# Patient Record
Sex: Female | Born: 1990 | Race: Black or African American | Hispanic: No | Marital: Single | Smoking: Never smoker
Health system: Southern US, Community
[De-identification: ages and names within clinical notes are randomized; demographics above are authoritative.]

## PROBLEM LIST (undated history)

## (undated) DIAGNOSIS — D649 Anemia, unspecified: Secondary | ICD-10-CM

## (undated) DIAGNOSIS — J45909 Unspecified asthma, uncomplicated: Secondary | ICD-10-CM

## (undated) DIAGNOSIS — O24419 Gestational diabetes mellitus in pregnancy, unspecified control: Secondary | ICD-10-CM

## (undated) DIAGNOSIS — J302 Other seasonal allergic rhinitis: Secondary | ICD-10-CM

## (undated) DIAGNOSIS — A549 Gonococcal infection, unspecified: Secondary | ICD-10-CM

## (undated) DIAGNOSIS — I10 Essential (primary) hypertension: Secondary | ICD-10-CM

## (undated) DIAGNOSIS — E282 Polycystic ovarian syndrome: Secondary | ICD-10-CM

## (undated) HISTORY — PX: HAND SURGERY: SHX662

## (undated) HISTORY — DX: Morbid (severe) obesity due to excess calories: E66.01

---

## 2012-01-15 ENCOUNTER — Emergency Department (HOSPITAL_COMMUNITY)
Admission: EM | Admit: 2012-01-15 | Discharge: 2012-01-15 | Disposition: A | Discharge status: Home / Self Care | Payer: Medicaid Other | Attending: Emergency Medicine | Admitting: Emergency Medicine

## 2012-01-15 ENCOUNTER — Encounter (HOSPITAL_COMMUNITY): Payer: Self-pay

## 2012-01-15 DIAGNOSIS — Z202 Contact with and (suspected) exposure to infections with a predominantly sexual mode of transmission: Secondary | ICD-10-CM

## 2012-01-15 DIAGNOSIS — M545 Low back pain, unspecified: Secondary | ICD-10-CM | POA: Insufficient documentation

## 2012-01-15 DIAGNOSIS — J45909 Unspecified asthma, uncomplicated: Secondary | ICD-10-CM | POA: Insufficient documentation

## 2012-01-15 HISTORY — DX: Unspecified asthma, uncomplicated: J45.909

## 2012-01-15 LAB — WET PREP, GENITAL
Clue Cells Wet Prep HPF POC: NONE SEEN
Trich, Wet Prep: NONE SEEN

## 2012-01-15 LAB — URINALYSIS, ROUTINE W REFLEX MICROSCOPIC
Hgb urine dipstick: NEGATIVE
Ketones, ur: NEGATIVE mg/dL
Protein, ur: NEGATIVE mg/dL
Urobilinogen, UA: 0.2 mg/dL (ref 0.0–1.0)

## 2012-01-15 LAB — POCT PREGNANCY, URINE: Preg Test, Ur: NEGATIVE

## 2012-01-15 MED ORDER — LIDOCAINE HCL (PF) 1 % IJ SOLN
INTRAMUSCULAR | Status: AC
  Administered 2012-01-15: 0.9 mL
  Filled 2012-01-15: qty 5

## 2012-01-15 MED ORDER — CEFTRIAXONE SODIUM 250 MG IJ SOLR
250.0000 mg | Freq: Once | INTRAMUSCULAR | Status: AC
  Administered 2012-01-15: 250 mg via INTRAMUSCULAR
  Filled 2012-01-15: qty 250

## 2012-01-15 MED ORDER — AZITHROMYCIN 250 MG PO TABS
1000.0000 mg | ORAL_TABLET | Freq: Once | ORAL | Status: AC
  Administered 2012-01-15: 1000 mg via ORAL
  Filled 2012-01-15: qty 4

## 2012-01-15 NOTE — ED Notes (Signed)
Pt denies burning or difficulty with urination. Denies vaginal d/c or odor. States she is having left lower back pain.

## 2012-01-15 NOTE — ED Provider Notes (Signed)
Medical screening examination/treatment/procedure(s) were performed by non-physician practitioner and as supervising physician I was immediately available for consultation/collaboration.  Doug Sou, MD 01/15/12 [phone]

## 2012-01-15 NOTE — ED Provider Notes (Signed)
History     [name]: [id number]  Arrival date & time 01/15/12  1228   First MD Initiated Contact with Patient 01/15/12 1410      Chief Complaint  Patient presents with  . SEXUALLY TRANSMITTED DISEASE    (Consider location/radiation/quality/duration/timing/severity/associated sxs/prior treatment) HPI Comments: Patient comes in today with concern that she may have a STD.  She reports that her boyfriend was recently diagnosed with Chlamydia.  She denies any vaginal discharge.  No fevers or chills.  No nausea or vomiting.  She is currently sexually active with her boyfriend and has unprotected sex.  She reports that she has also had lower back pain for the past 2 months.  She denies any difficulty with gait.  No numbness or tingling.  No urinary or bowel incontinence.  Pain worse when she twists her back.  Pain relieved with Ibuprofen.    The history is provided by the patient.    Past Medical History  Diagnosis Date  . Asthma     History reviewed. No pertinent past surgical history.  History reviewed. No pertinent family history.  History  Substance Use Topics  . Smoking status: Never Smoker   . Smokeless tobacco: Not on file  . Alcohol Use: No    OB History    Grav Para Term Preterm Abortions TAB SAB Ect Mult Living                  Review of Systems  Constitutional: Negative for fever and chills.  Gastrointestinal: Negative for nausea, vomiting and diarrhea.  Genitourinary: Negative for dysuria, urgency, frequency, hematuria, vaginal bleeding, vaginal discharge, difficulty urinating, genital sores, vaginal pain, pelvic pain and dyspareunia.  Musculoskeletal: Positive for back pain. Negative for gait problem.  Skin: Negative for rash.    Allergies  Aleve and Cherry  Home Medications   Current Outpatient Rx  Name Route Sig Dispense Refill  . ALBUTEROL SULFATE HFA 108 (90 BASE) MCG/ACT IN AERS Inhalation Inhale 2 puffs into the lungs every 6 (six) hours as needed. For  shortness of breath      BP 136/77  Pulse 86  Temp 98.3 F (36.8 C) (Oral)  Resp 18  SpO2 98%  LMP 12/28/2011  Physical Exam  Nursing note and vitals reviewed. Constitutional: She appears well-developed and well-nourished. No distress.       Morbidly obese  HENT:  Head: Normocephalic and atraumatic.  Neck: Normal range of motion. Neck supple.  Cardiovascular: Normal rate, regular rhythm and normal heart sounds.   Pulmonary/Chest: Effort normal and breath sounds normal.  Abdominal: Soft. Bowel sounds are normal. She exhibits no distension and no mass. There is no tenderness. There is no rebound and no guarding.  Genitourinary: Vagina normal and uterus normal. There is no rash, tenderness or lesion on the right labia. There is no rash, tenderness or lesion on the left labia. Cervix exhibits no motion tenderness. Right adnexum displays no mass, no tenderness and no fullness. Left adnexum displays no mass, no tenderness and no fullness.  Neurological: She is alert.  Skin: Skin is warm and dry. She is not diaphoretic.  Psychiatric: She has a normal mood and affect.    ED Course  Procedures (including critical care time)   Labs Reviewed  URINALYSIS, ROUTINE W REFLEX MICROSCOPIC  POCT PREGNANCY, URINE  GC/CHLAMYDIA PROBE AMP, GENITAL  WET PREP, GENITAL   No results found.   No diagnosis found.    MDM  Patient presenting today with possible STD exposure.  Her boyfriend was recently diagnosed with Chlamydia.  Pelvic exam performed.  No abnormal discharge.  No CMT or adnexal tenderness.  Patient afebrile.  UA and urine pregnancy negative.  Patient given prophylactic treatment with Ceftriaxone and Azithromycin.  GC/Chlamydia pending.  Return precautions discussed with patient.          Pascal Lux [place], PA-C 01/15/12 1630

## 2012-01-15 NOTE — ED Notes (Signed)
Was seen [facility] and dx with trichomonas then her boyfriend is now being treated for chlamydia and she needs to be treated, pt complains of back pain and left side swelling on back.

## 2012-07-26 ENCOUNTER — Encounter (HOSPITAL_COMMUNITY): Payer: Self-pay | Admitting: Emergency Medicine

## 2012-07-26 ENCOUNTER — Emergency Department (HOSPITAL_COMMUNITY)
Admission: EM | Admit: 2012-07-26 | Discharge: 2012-07-26 | Disposition: A | Discharge status: Home / Self Care | Payer: Self-pay | Attending: Emergency Medicine | Admitting: Emergency Medicine

## 2012-07-26 DIAGNOSIS — Z8742 Personal history of other diseases of the female genital tract: Secondary | ICD-10-CM | POA: Insufficient documentation

## 2012-07-26 DIAGNOSIS — L0201 Cutaneous abscess of face: Secondary | ICD-10-CM | POA: Insufficient documentation

## 2012-07-26 DIAGNOSIS — L03211 Cellulitis of face: Secondary | ICD-10-CM | POA: Insufficient documentation

## 2012-07-26 DIAGNOSIS — J45909 Unspecified asthma, uncomplicated: Secondary | ICD-10-CM | POA: Insufficient documentation

## 2012-07-26 MED ORDER — SULFAMETHOXAZOLE-TRIMETHOPRIM 800-160 MG PO TABS: 1.0000 | ORAL_TABLET | Freq: Two times a day (BID) | ORAL | Status: DC

## 2012-07-26 NOTE — ED Provider Notes (Signed)
History    This chart was scribed for non-physician practitioner working with Carleene Cooper III, MD by Frederik Pear, ED Scribe. This patient was seen in room TR10C/TR10C and the patient's care was started at 2008.   [name]: [id number]  Arrival date & time 07/26/12  1908   First MD Initiated Contact with Patient 07/26/12 2008      Chief Complaint  Patient presents with  . Abscess    (Consider location/radiation/quality/duration/timing/severity/associated sxs/prior treatment) The history is provided by the patient and medical records. No language interpreter was used.    Tricia Ray is a 22 y.o. female who presents to the Emergency Department complaining of a gradual onset, constant- non-radiating abscess that began a week ago, but worsened yesterday after she popped it. She states that she was able to obtain purulent drainage from the area after she popped it. She reports that she treated the area at home with hot compresses and applying toothpaste.She has a h/o of PCOS, but denies any h/o of DM or any other chronic medical conditions that require daily medication. She is allergic to Aleve and cherries. She denies any h/o of IV drug use.   Past Medical History  Diagnosis Date  . Asthma     History reviewed. No pertinent past surgical history.  History reviewed. No pertinent family history.  History  Substance Use Topics  . Smoking status: Never Smoker   . Smokeless tobacco: Not on file  . Alcohol Use: No    OB History   Grav Para Term Preterm Abortions TAB SAB Ect Mult Living                  Review of Systems  Constitutional: Negative for diaphoresis, appetite change and unexpected weight change.  HENT: Negative for mouth sores and neck stiffness.   Eyes: Negative for visual disturbance.  Respiratory: Negative for cough, chest tightness, shortness of breath and wheezing.   Cardiovascular: Negative for chest pain.  Gastrointestinal: Negative for abdominal pain, diarrhea  and constipation.  Endocrine: Negative for polydipsia, polyphagia and polyuria.  Genitourinary: Negative for dysuria, urgency, frequency and hematuria.  Musculoskeletal: Negative for back pain.  Skin: Negative for rash.       Abscess  Allergic/Immunologic: Negative for immunocompromised state.  Neurological: Negative for syncope and light-headedness.  Hematological: Does not bruise/bleed easily.  Psychiatric/Behavioral: Negative for sleep disturbance. The patient is not nervous/anxious.   All other systems reviewed and are negative.    Allergies  Aleve and Cherry  Home Medications   Current Outpatient Rx  Name  Route  Sig  Dispense  Refill  . sulfamethoxazole-trimethoprim (SEPTRA DS) 800-160 MG per tablet   Oral   Take 1 tablet by mouth every 12 (twelve) hours.   20 tablet   0     BP 140/74  Pulse 73  Temp(Src) 98.4 F (36.9 C) (Oral)  Resp 18  SpO2 98%  LMP 06/19/2012  Physical Exam  Nursing note and vitals reviewed. Constitutional: She is oriented to person, place, and time. She appears well-developed and well-nourished. No distress.  HENT:  Head: Normocephalic and atraumatic.    Right Ear: External ear normal.  Left Ear: External ear normal.  Nose: Nose normal.  Mouth/Throat: Uvula is midline, oropharynx is clear and moist and mucous membranes are normal. No oropharyngeal exudate, posterior oropharyngeal edema, posterior oropharyngeal erythema or tonsillar abscesses.  Eyes: Conjunctivae are normal. No scleral icterus.  Neck: Normal range of motion.  Cardiovascular: Normal rate, regular rhythm, normal heart  sounds and intact distal pulses.  Exam reveals no gallop and no friction rub.   No murmur heard. Pulmonary/Chest: Effort normal and breath sounds normal. No respiratory distress. She has no wheezes. She has no rales.  Abdominal: Soft. She exhibits no distension. There is no tenderness.  Lymphadenopathy:    She has no cervical adenopathy.  Neurological: She  is alert and oriented to person, place, and time. No cranial nerve deficit.  Skin: Skin is warm and dry. Lesion noted. She is not diaphoretic. There is erythema.  On the left side of the chin, there is an erythematous, indurated lesion with mild cellulitis. There is a scab on the top that has previously been draining.  Psychiatric: She has a normal mood and affect.    ED Course  Procedures (including critical care time)  DIAGNOSTIC STUDIES: Oxygen Saturation is 98% on room air, normal by my interpretation.    COORDINATION OF CARE:  20:47- Discussed planned course of treatment with the patient, including applying Neosporin to the area, Bactrim, and applying hot compresses 3x a day, who is agreeable at this time.  20:49- INCISION AND DRAINAGE Performed by: Dahlia Client Jonquil Stubbe- PA-C Consent: Verbal consent obtained. Risks and benefits: risks, benefits and alternatives were discussed Type: abscess  Body area: left side of the chin  Anesthesia: local infiltration  Incision was made with an 18 gauge needle.  Local anesthetic: lidocaine 2% without epinephrine  Anesthetic total: 1cc  Complexity: complex  Drainage: purulent  Drainage amount: minimal  Packing material: N/A  Patient tolerance: Patient tolerated the procedure well with no immediate complications.     Labs Reviewed - No data to display No results found.   1. Abscess of face       MDM  Zadie Monical presents with small abscess to the face.  Patient with skin abscess amenable to incision and drainage.  Abscess was not large enough to warrant packing or drain,  wound recheck in 2 days. Encouraged home warm soaks and flushing.  Mild signs of cellulitis is surrounding skin.  Will d/c to home.  Pt given bactrim as very little purulent drainage was expressed from the site.  He is without history of diabetes or at risk for HIV. The patient has not been on prednisone for her asthma in the recent past.I have also  discussed reasons to return immediately to the ER.  Patient expresses understanding and agrees with plan.    1. Medications: Bactrim, usual home medications 2. Treatment: rest, drink plenty of fluids, warm compress 3. Follow Up: Please followup with your primary doctor for discussion of your diagnoses and further evaluation after today's visit; if you do not have a primary care doctor use the resource guide provided to find one;    I personally performed the services described in this documentation, which was scribed in my presence. The recorded information has been reviewed and is accurate.        Dahlia Client Katlynn Naser, PA-C 07/26/12 2105

## 2012-07-26 NOTE — ED Notes (Signed)
Patient complaining of bump/pimple on chin that appeared last Saturday.  Patient reports that she popped it yesterday and it became worse.  Scabbed area noted to chin with yellow drainage.

## 2012-07-27 NOTE — ED Provider Notes (Signed)
Medical screening examination/treatment/procedure(s) were performed by non-physician practitioner and as supervising physician I was immediately available for consultation/collaboration.   Carleene Cooper III, MD 07/27/12 1302

## 2013-01-07 ENCOUNTER — Emergency Department (HOSPITAL_COMMUNITY)
Admission: EM | Admit: 2013-01-07 | Discharge: 2013-01-07 | Discharge status: Against Medical Advice | Payer: Self-pay | Attending: Emergency Medicine | Admitting: Emergency Medicine

## 2013-01-07 ENCOUNTER — Encounter (HOSPITAL_COMMUNITY): Payer: Self-pay | Admitting: Emergency Medicine

## 2013-01-07 DIAGNOSIS — R109 Unspecified abdominal pain: Secondary | ICD-10-CM | POA: Insufficient documentation

## 2013-01-07 DIAGNOSIS — J45909 Unspecified asthma, uncomplicated: Secondary | ICD-10-CM | POA: Insufficient documentation

## 2013-01-07 LAB — COMPREHENSIVE METABOLIC PANEL
ALT: 13 U/L (ref 0–35)
AST: 14 U/L (ref 0–37)
Alkaline Phosphatase: 86 U/L (ref 39–117)
CO2: 23 mEq/L (ref 19–32)
Chloride: 107 mEq/L (ref 96–112)
GFR calc Af Amer: >90 mL/min (ref >90)
GFR calc non Af Amer: >90 mL/min (ref >90)
Glucose, Bld: 83 mg/dL (ref 70–99)
Potassium: 4.1 mEq/L (ref 3.5–5.1)
Sodium: 138 mEq/L (ref 135–145)

## 2013-01-07 LAB — CBC WITH DIFFERENTIAL/PLATELET
Basophils Absolute: 0 10*3/uL (ref 0.0–0.1)
Lymphocytes Relative: 36 % (ref 12–46)
Lymphs Abs: 2.7 10*3/uL (ref 0.7–4.0)
MCV: 84.8 fL (ref 78.0–100.0)
Neutro Abs: 4 10*3/uL (ref 1.7–7.7)
Neutrophils Relative %: 54 % (ref 43–77)
Platelets: 231 10*3/uL (ref 150–400)
RBC: 4.47 MIL/uL (ref 3.87–5.11)
WBC: 7.5 10*3/uL (ref 4.0–10.5)

## 2013-01-07 NOTE — ED Notes (Signed)
Patient presents to ED today with complaints of abdominal pain n/v/d since last night.

## 2013-01-21 ENCOUNTER — Emergency Department (HOSPITAL_COMMUNITY)
Admission: EM | Admit: 2013-01-21 | Discharge: 2013-01-21 | Disposition: A | Discharge status: Home / Self Care | Payer: Self-pay | Attending: Emergency Medicine | Admitting: Emergency Medicine

## 2013-01-21 ENCOUNTER — Encounter (HOSPITAL_COMMUNITY): Payer: Self-pay | Admitting: Nurse Practitioner

## 2013-01-21 DIAGNOSIS — J45909 Unspecified asthma, uncomplicated: Secondary | ICD-10-CM | POA: Insufficient documentation

## 2013-01-21 DIAGNOSIS — R109 Unspecified abdominal pain: Secondary | ICD-10-CM | POA: Insufficient documentation

## 2013-01-21 DIAGNOSIS — Z3202 Encounter for pregnancy test, result negative: Secondary | ICD-10-CM | POA: Insufficient documentation

## 2013-01-21 DIAGNOSIS — Z79899 Other long term (current) drug therapy: Secondary | ICD-10-CM | POA: Insufficient documentation

## 2013-01-21 DIAGNOSIS — N390 Urinary tract infection, site not specified: Secondary | ICD-10-CM | POA: Insufficient documentation

## 2013-01-21 LAB — URINALYSIS, ROUTINE W REFLEX MICROSCOPIC
Bilirubin Urine: NEGATIVE
Hgb urine dipstick: NEGATIVE
Ketones, ur: NEGATIVE mg/dL
Nitrite: POSITIVE — AB
Urobilinogen, UA: 1 mg/dL (ref 0.0–1.0)

## 2013-01-21 LAB — URINE MICROSCOPIC-ADD ON

## 2013-01-21 MED ORDER — AZITHROMYCIN 250 MG PO TABS
1000.0000 mg | ORAL_TABLET | Freq: Once | ORAL | Status: AC
  Administered 2013-01-21: 1000 mg via ORAL
  Filled 2013-01-21: qty 4

## 2013-01-21 MED ORDER — CEFTRIAXONE SODIUM 250 MG IJ SOLR
250.0000 mg | Freq: Once | INTRAMUSCULAR | Status: AC
  Administered 2013-01-21: 250 mg via INTRAMUSCULAR
  Filled 2013-01-21: qty 250

## 2013-01-21 MED ORDER — TRAMADOL HCL 50 MG PO TABS: 50.0000 mg | ORAL_TABLET | Freq: Four times a day (QID) | ORAL | Status: DC | PRN

## 2013-01-21 MED ORDER — LIDOCAINE HCL (PF) 1 % IJ SOLN
INTRAMUSCULAR | Status: AC
  Administered 2013-01-21: 2 mL
  Filled 2013-01-21: qty 5

## 2013-01-21 MED ORDER — METRONIDAZOLE 500 MG PO TABS: 500.0000 mg | ORAL_TABLET | Freq: Two times a day (BID) | ORAL | Status: DC

## 2013-01-21 MED ORDER — SULFAMETHOXAZOLE-TRIMETHOPRIM 800-160 MG PO TABS: 1.0000 | ORAL_TABLET | Freq: Two times a day (BID) | ORAL | Status: DC

## 2013-01-21 NOTE — ED Notes (Signed)
Family at bedside. 

## 2013-01-21 NOTE — ED Notes (Signed)
Pt reports she was here last week for nausea and pelvic cramps, had to leave [facility] because she was late for work. Continues to have symptoms and this week her boyfriend was diagnosed with NGU so she is concerned for std

## 2013-01-21 NOTE — ED Notes (Signed)
Pt discharged.Vital signs stable and GCS 15 

## 2013-01-21 NOTE — ED Provider Notes (Signed)
[name]: [id number]     Arrival date & time 01/21/13  1339 History     First MD Initiated Contact with Patient 01/21/13 1740     Chief Complaint  Patient presents with  . Abdominal Cramping   (Consider location/radiation/quality/duration/timing/severity/associated sxs/prior Treatment) HPI Comments: 22 year old female who presents with lower abdominal cramping. She was seen here last week for nausea and pelvic cramps left before her workup was complete. She returns today after her boyfriend told her that he had a nongonococcal urethritis. She states that she's been having mild abdominal cramps which have been relatively persistent over time but waxed and waned throughout the day. There is no fevers chills nausea or vomiting. She is concerned for STD as she is not using protection. She only has one sexual partner, she has had 2 other sexual partners in the past, has never contracted STD and states that she has been tested regularly when she changes partners. Denies rashes, diarrhea, dysuria  Patient is a 22 y.o. female presenting with cramps. The history is provided by the patient and a relative.  Abdominal Cramping    Past Medical History  Diagnosis Date  . Asthma    History reviewed. No pertinent past surgical history. History reviewed. No pertinent family history. History  Substance Use Topics  . Smoking status: Never Smoker   . Smokeless tobacco: Not on file  . Alcohol Use: No   OB History   Grav Para Term Preterm Abortions TAB SAB Ect Mult Living                 Review of Systems  All other systems reviewed and are negative.    Allergies  Shrimp; Aleve; and Cherry  Home Medications   Current Outpatient Rx  Name  Route  Sig  Dispense  Refill  . albuterol (PROVENTIL HFA;VENTOLIN HFA) 108 (90 BASE) MCG/ACT inhaler   Inhalation   Inhale 2 puffs into the lungs every 6 (six) hours as needed for wheezing or shortness of breath.          . cetirizine (ZYRTEC) 10 MG  tablet   Oral   Take 10 mg by mouth daily as needed for allergies.         Marland Kitchen ibuprofen (ADVIL,MOTRIN) 200 MG tablet   Oral   Take 400 mg by mouth daily as needed for pain.         . metroNIDAZOLE (FLAGYL) 500 MG tablet   Oral   Take 1 tablet (500 mg total) by mouth 2 (two) times daily.   20 tablet   0   . sulfamethoxazole-trimethoprim (SEPTRA DS) 800-160 MG per tablet   Oral   Take 1 tablet by mouth every 12 (twelve) hours.   10 tablet   0   . traMADol (ULTRAM) 50 MG tablet   Oral   Take 1 tablet (50 mg total) by mouth every 6 (six) hours as needed for pain.   15 tablet   0    BP 132/78  Pulse 73  Temp(Src) 97.6 F (36.4 C) (Oral)  Resp 18  Ht 5\' 7"  (1.702 m)  Wt 275 lb (124.739 kg)  BMI 43.06 kg/m2  SpO2 100% Physical Exam  Nursing note and vitals reviewed. Constitutional: She appears well-developed and well-nourished. No distress.  HENT:  Head: Normocephalic and atraumatic.  Mouth/Throat: Oropharynx is clear and moist. No oropharyngeal exudate.  Eyes: Conjunctivae and EOM are normal. Pupils are equal, round, and reactive to light. Right eye exhibits no discharge. Left  eye exhibits no discharge. No scleral icterus.  Neck: Normal range of motion. Neck supple. No JVD present. No thyromegaly present.  Cardiovascular: Normal rate, regular rhythm, normal heart sounds and intact distal pulses.  Exam reveals no gallop and no friction rub.   No murmur heard. Pulmonary/Chest: Effort normal and breath sounds normal. No respiratory distress. She has no wheezes. She has no rales.  Abdominal: Soft. Bowel sounds are normal. She exhibits no distension and no mass. There is no tenderness.  Genitourinary:  Patient declines exam, deferred to primary doctor or GYN  Musculoskeletal: Normal range of motion. She exhibits no edema and no tenderness.  Lymphadenopathy:    She has no cervical adenopathy.  Neurological: She is alert. Coordination normal.  Skin: Skin is warm and dry.  No rash noted. No erythema.  Psychiatric: She has a normal mood and affect. Her behavior is normal.    ED Course   Procedures (including critical care time)  Labs Reviewed  URINALYSIS, ROUTINE W REFLEX MICROSCOPIC - Abnormal; Notable for the following:    APPearance CLOUDY (*)    Nitrite POSITIVE (*)    Leukocytes, UA SMALL (*)    All other components within normal limits  URINE MICROSCOPIC-ADD ON - Abnormal; Notable for the following:    Squamous Epithelial / LPF FEW (*)    Bacteria, UA MANY (*)    All other components within normal limits  URINE CULTURE  POCT PREGNANCY, URINE   No results found. 1. Abdominal cramping   2. UTI (urinary tract infection)     MDM  The patient is morbidly obese but does have a soft and nontender abdomen. Her urinalysis reveals that she has many bacteria but positive nitrite and few white blood cells. In the absence of any other strong sources for this symptom I would suggest treating her with antibiotics for urinary infection. Because of her boyfriends STD status would also be worthwhile to treat her with troponin I therapy with Flagyl, 1 g azithromycin, 250 mg of Rocephin intramuscular and Bactrim for her UTI. The patient is in agreement with the plan appears nontoxic.   Meds given in ED:  Medications  cefTRIAXone (ROCEPHIN) injection 250 mg (not administered)  azithromycin (ZITHROMAX) tablet 1,000 mg (not administered)    New Prescriptions   METRONIDAZOLE (FLAGYL) 500 MG TABLET    Take 1 tablet (500 mg total) by mouth 2 (two) times daily.   SULFAMETHOXAZOLE-TRIMETHOPRIM (SEPTRA DS) 800-160 MG PER TABLET    Take 1 tablet by mouth every 12 (twelve) hours.   TRAMADOL (ULTRAM) 50 MG TABLET    Take 1 tablet (50 mg total) by mouth every 6 (six) hours as needed for pain.      Vida Roller, MD 01/21/13 [phone]

## 2013-01-22 LAB — URINE CULTURE: Colony Count: >=100000

## 2013-01-23 NOTE — ED Notes (Signed)
+   Urine Culture- treated with appropriate medication per protocol MD. 

## 2013-05-28 ENCOUNTER — Emergency Department (HOSPITAL_COMMUNITY)
Admission: EM | Admit: 2013-05-28 | Discharge: 2013-05-28 | Disposition: A | Discharge status: Home / Self Care | Payer: Medicaid Other | Attending: Emergency Medicine | Admitting: Emergency Medicine

## 2013-05-28 ENCOUNTER — Encounter (HOSPITAL_COMMUNITY): Payer: Self-pay | Admitting: Emergency Medicine

## 2013-05-28 ENCOUNTER — Emergency Department (HOSPITAL_COMMUNITY): Payer: Medicaid Other

## 2013-05-28 DIAGNOSIS — Y93D2 Activity, sewing: Secondary | ICD-10-CM | POA: Insufficient documentation

## 2013-05-28 DIAGNOSIS — W278XXA Contact with other nonpowered hand tool, initial encounter: Secondary | ICD-10-CM | POA: Insufficient documentation

## 2013-05-28 DIAGNOSIS — Z79899 Other long term (current) drug therapy: Secondary | ICD-10-CM | POA: Insufficient documentation

## 2013-05-28 DIAGNOSIS — S60559A Superficial foreign body of unspecified hand, initial encounter: Secondary | ICD-10-CM | POA: Insufficient documentation

## 2013-05-28 DIAGNOSIS — M2550 Pain in unspecified joint: Secondary | ICD-10-CM | POA: Insufficient documentation

## 2013-05-28 DIAGNOSIS — Y929 Unspecified place or not applicable: Secondary | ICD-10-CM | POA: Insufficient documentation

## 2013-05-28 DIAGNOSIS — S60552A Superficial foreign body of left hand, initial encounter: Secondary | ICD-10-CM

## 2013-05-28 DIAGNOSIS — J45909 Unspecified asthma, uncomplicated: Secondary | ICD-10-CM | POA: Insufficient documentation

## 2013-05-28 MED ORDER — AMOXICILLIN-POT CLAVULANATE 875-125 MG PO TABS: 1.0000 | ORAL_TABLET | Freq: Two times a day (BID) | ORAL | Status: DC

## 2013-05-28 NOTE — ED Notes (Signed)
Pt st's she put her hand down on a sewing needle 5 days ago.  Now palm of hand is swollen and painful.  St's not sure if part of the needle is still in her hand.

## 2013-05-28 NOTE — ED Provider Notes (Signed)
[name]: [id number]     Arrival date & time 05/28/13  1602 History  This chart was scribed for non-physician practitioner Tricia Forth, PA-C, working with Tricia Jakes, MD by Tricia Ray, ED Scribe. This patient was seen in room TR08C/TR08C and the patient's care was started at 1602.    Chief Complaint  Patient presents with  . Foreign Body    The history is provided by the patient and medical records. No language interpreter was used.    HPI Comments: Tricia Ray is a 22 y.o. female who presents to the Emergency Department complaining of a foreign body to the left palm that occurred 5 days ago. Pt states she was sewing when she put her hand down on a sewing needle. She states feeling a part of the needle stuck within the palmer surface of her left index finger. Her last tetanus was about 2 years ago. She denies drainage, fever, chills, nausea, vomiting.      Past Medical History  Diagnosis Date  . Asthma    History reviewed. No pertinent past surgical history. No family history on file. History  Substance Use Topics  . Smoking status: Never Smoker   . Smokeless tobacco: Not on file  . Alcohol Use: No    OB History   Grav Para Term Preterm Abortions TAB SAB Ect Mult Living                 Review of Systems  Constitutional: Negative for fever and chills.  Respiratory: Negative for shortness of breath.   Gastrointestinal: Negative for nausea, vomiting and abdominal pain.  Musculoskeletal: Positive for arthralgias and joint swelling.  Skin: Positive for wound (foreign body ).  Allergic/Immunologic: Negative for immunocompromised state.  Neurological: Negative for numbness.  Hematological: Does not bruise/bleed easily.    Allergies  Shrimp; Aleve; and Cherry  Home Medications   Current Outpatient Rx  Name  Route  Sig  Dispense  Refill  . albuterol (PROVENTIL HFA;VENTOLIN HFA) 108 (90 BASE) MCG/ACT inhaler   Inhalation   Inhale 2 puffs into the lungs every 6  (six) hours as needed for wheezing or shortness of breath.          Marland Kitchen amoxicillin-clavulanate (AUGMENTIN) 875-125 MG per tablet   Oral   Take 1 tablet by mouth 2 (two) times daily. One po bid x 7 days   14 tablet   0   . cetirizine (ZYRTEC) 10 MG tablet   Oral   Take 10 mg by mouth daily as needed for allergies.         Marland Kitchen ibuprofen (ADVIL,MOTRIN) 200 MG tablet   Oral   Take 400 mg by mouth daily as needed for pain.         . metroNIDAZOLE (FLAGYL) 500 MG tablet   Oral   Take 1 tablet (500 mg total) by mouth 2 (two) times daily.   20 tablet   0   . sulfamethoxazole-trimethoprim (SEPTRA DS) 800-160 MG per tablet   Oral   Take 1 tablet by mouth every 12 (twelve) hours.   10 tablet   0   . traMADol (ULTRAM) 50 MG tablet   Oral   Take 1 tablet (50 mg total) by mouth every 6 (six) hours as needed for pain.   15 tablet   0    BP 130/73  Pulse 89  Temp(Src) 97.8 F (36.6 C) (Oral)  Resp 16  Ht 5\' 7"  (1.702 m)  Wt 304 lb 1 oz (  137.922 kg)  BMI 47.61 kg/m2  SpO2 98%  LMP 04/14/2013 Physical Exam  Nursing note and vitals reviewed. Constitutional: She is oriented to person, place, and time. She appears well-developed and well-nourished. No distress.  HENT:  Head: Normocephalic and atraumatic.  Eyes: Conjunctivae are normal.  Neck: Normal range of motion.  Cardiovascular: Normal rate, regular rhythm, normal heart sounds and intact distal pulses.   No murmur heard. Capillary refill less than 3 seconds  Pulmonary/Chest: Effort normal and breath sounds normal. No respiratory distress.  Musculoskeletal: She exhibits tenderness. She exhibits no edema.  ROM: Decreased ROM of the pointer finger on the left due to pain and swelling.   Left MCP joint on pointer finger is swollen and tender. Palmer surface of the left hand is erythematous without induration or evidence of abscess.   Neurological: She is alert and oriented to person, place, and time. She exhibits normal  muscle tone. Coordination normal.  Sensation intact to dull and sharp Strength 5/5 in the bilateral upper extremities with strong grip strength  Skin: Skin is warm and dry. No rash noted. She is not diaphoretic. No erythema.  No tenting of the skin  Psychiatric: She has a normal mood and affect.    ED Course  Procedures (including critical care time)  DIAGNOSTIC STUDIES: Oxygen Saturation is 98% on RA, normal by my interpretation.    COORDINATION OF CARE: 5:30 PM Discussed treatment plan with pt at bedside and pt agreed to plan.   Labs Review Labs Reviewed - No data to display Imaging Review Dg Hand Complete Left  05/28/2013   CLINICAL DATA:  Puncture wound with a sewing needle  EXAM: LEFT HAND - COMPLETE 3+ VIEW  COMPARISON:  None.  FINDINGS: Small linear radiopaque foreign body projects along the left 2nd MCP joint compatible with a needle fragment. No osseous abnormality or malalignment. Preserved joint spaces.  IMPRESSION: Radiopaque needle fragment foreign body along the left 2nd MCP joint.   Electronically Signed   By: Tricia Ray M.D.   On: 05/28/2013 16:57    EKG Interpretation   None       MDM   1. Foreign body of left hand, initial encounter      Tricia Ray presents with foreign body in the left hand.  X-ray confirms a sewing needle in the palm of the left hand.  Wound is not draining pus. No induration or evidence of abscess.  I personally reviewed the imaging tests through PACS system  I reviewed available ER/hospitalization records through the EMR  I discussed this with Dr. Mina Ray who reports he will take her to surgery later in the week.  Will put her on Augmentin as antibiotic prophylaxis.  He will contact her tomorrow to schedule surgery.  It has been determined that no acute conditions requiring further emergency intervention are present at this time. The patient/guardian have been advised of the diagnosis and plan. We have discussed signs and  symptoms that warrant return to the ED, such as changes or worsening in symptoms.   Vital signs are stable at discharge.   BP 130/73  Pulse 89  Temp(Src) 97.8 F (36.6 C) (Oral)  Resp 16  Ht 5\' 7"  (1.702 m)  Wt 304 lb 1 oz (137.922 kg)  BMI 47.61 kg/m2  SpO2 98%  LMP 04/14/2013  Patient/guardian has voiced understanding and agreed to follow-up with the PCP or specialist.    I personally performed the services described in this documentation, which was scribed in  my presence. The recorded information has been reviewed and is accurate.   Tricia Forth, PA-C 05/28/13 [phone]

## 2013-06-02 NOTE — ED Provider Notes (Signed)
Medical screening examination/treatment/procedure(s) were performed by non-physician practitioner and as supervising physician I was immediately available for consultation/collaboration.  EKG Interpretation   None         [name], MD 06/02/13 [phone]

## 2014-10-02 ENCOUNTER — Encounter (HOSPITAL_COMMUNITY): Payer: Self-pay | Admitting: Emergency Medicine

## 2014-10-02 ENCOUNTER — Emergency Department (HOSPITAL_COMMUNITY): Payer: 59

## 2014-10-02 ENCOUNTER — Emergency Department (HOSPITAL_COMMUNITY)
Admission: EM | Admit: 2014-10-02 | Discharge: 2014-10-02 | Disposition: A | Discharge status: Home / Self Care | Payer: 59 | Attending: Emergency Medicine | Admitting: Emergency Medicine

## 2014-10-02 DIAGNOSIS — Z79899 Other long term (current) drug therapy: Secondary | ICD-10-CM | POA: Diagnosis not present

## 2014-10-02 DIAGNOSIS — J45909 Unspecified asthma, uncomplicated: Secondary | ICD-10-CM | POA: Diagnosis present

## 2014-10-02 DIAGNOSIS — Z792 Long term (current) use of antibiotics: Secondary | ICD-10-CM | POA: Diagnosis not present

## 2014-10-02 DIAGNOSIS — J45901 Unspecified asthma with (acute) exacerbation: Secondary | ICD-10-CM

## 2014-10-02 MED ORDER — ALBUTEROL SULFATE HFA 108 (90 BASE) MCG/ACT IN AERS: 1.0000 | INHALATION_SPRAY | Freq: Four times a day (QID) | RESPIRATORY_TRACT | Status: DC | PRN

## 2014-10-02 NOTE — ED Provider Notes (Signed)
[name]: [id number]     Arrival date & time 10/02/14  1418 History  This chart was scribed for non-physician practitioner, Teressa Lower, NP, working with Mancel Bale, MD by Charline Bills, ED Scribe. This patient was seen in room TR08C/TR08C and the patient's care was started at 2:43 PM.   Chief Complaint  Patient presents with  . Asthma   The history is provided by the patient. No language interpreter was used.   HPI Comments: Tricia Ray is a 24 y.o. female, with a h/o asthma, who presents to the Emergency Department complaining of chest tightness since yesterday. 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 She does not have an inhaler at home; states that she has not had a flare up in a while. Pt denies fever. She is a smoker. LNMP 07/23/14; pt reports irregular menstrual periods.   Past Medical History  Diagnosis Date  . Asthma    Past Surgical History  Procedure Laterality Date  . Hand surgery     No family history on file. History  Substance Use Topics  . Smoking status: Never Smoker   . Smokeless tobacco: Not on file  . Alcohol Use: No   OB History    No data available     Review of Systems  Constitutional: Negative for fever.  Respiratory: Positive for chest tightness.   All other systems reviewed and are negative.  Allergies  Shrimp; Aleve; and Cherry  Home Medications   Prior to Admission medications   Medication Sig Start Date End Date Taking? Authorizing Provider  albuterol (PROVENTIL HFA;VENTOLIN HFA) 108 (90 BASE) MCG/ACT inhaler Inhale 2 puffs into the lungs every 6 (six) hours as needed for wheezing or shortness of breath.     Historical Provider, MD  amoxicillin-clavulanate (AUGMENTIN) 875-125 MG per tablet Take 1 tablet by mouth 2 (two) times daily. One po bid x 7 days 05/28/13   Dahlia Client Muthersbaugh, PA-C  cetirizine (ZYRTEC) 10 MG tablet Take 10 mg by mouth daily as needed for allergies.    Historical Provider,  MD  ibuprofen (ADVIL,MOTRIN) 200 MG tablet Take 400 mg by mouth daily as needed for pain.    Historical Provider, MD  metroNIDAZOLE (FLAGYL) 500 MG tablet Take 1 tablet (500 mg total) by mouth 2 (two) times daily. 01/21/13   Eber Hong, MD  sulfamethoxazole-trimethoprim (SEPTRA DS) 800-160 MG per tablet Take 1 tablet by mouth every 12 (twelve) hours. 01/21/13   Eber Hong, MD  traMADol (ULTRAM) 50 MG tablet Take 1 tablet (50 mg total) by mouth every 6 (six) hours as needed for pain. 01/21/13   Eber Hong, MD   BP 123/70 mmHg  Pulse 99  Temp(Src) 97.8 F (36.6 C)  Resp 16  SpO2 100%  LMP 07/23/2014 Physical Exam  Constitutional: She is oriented to person, place, and time. She appears well-developed and well-nourished. No distress.  HENT:  Head: Normocephalic and atraumatic.  Right Ear: External ear normal.  Left Ear: External ear normal.  Mouth/Throat: Oropharynx is clear and moist.  Eyes: Conjunctivae and EOM are normal.  Neck: Neck supple. No tracheal deviation present.  Cardiovascular: Normal rate.   Pulmonary/Chest: Effort normal. No respiratory distress. She has no wheezes. She has no rales.  Musculoskeletal: Normal range of motion.  Neurological: She is alert and oriented to person, place, and time.  Skin: Skin is warm and dry.  Psychiatric: She has a normal mood and affect. Her behavior is normal.  Nursing note  and vitals reviewed.  ED Course  Procedures (including critical care time) DIAGNOSTIC STUDIES: Oxygen Saturation is 100% on RA, nomal by my interpretation.    COORDINATION OF CARE: 2:46 PM-Discussed treatment plan which includes CXR with pt at bedside and pt agreed to plan.   Labs Review Labs Reviewed - No data to display  Imaging Review Dg Chest 2 View  10/02/2014   CLINICAL DATA:  Asthma flare beginning yesterday.  EXAM: CHEST  2 VIEW  COMPARISON:  None.  FINDINGS: The cardiomediastinal silhouette is within normal limits. The lungs are well inflated and  clear. There is no evidence of pleural effusion or pneumothorax. No acute osseous abnormality is identified.  IMPRESSION: No active cardiopulmonary disease.   Electronically Signed   By: Sebastian AcheAllen  Grady   On: 10/02/2014 15:41     EKG Interpretation   Date/Time:  Friday October 02 2014 14:54:19 EDT Ventricular Rate:  100 PR Interval:  142 QRS Duration: 72 QT Interval:  338 QTC Calculation: 436 R Axis:   32 Text Interpretation:  Normal sinus rhythm Normal ECG No old tracing to  compare Confirmed by [facility] Women[facility] HospitalWENTZ  MD, ELLIOTT [phone]([id number]) on 10/02/2014 3:15:07 PM      MDM   Final diagnoses:  Asthma attack    Pt not wheezing here. No infection  Noted on xray. Pt is okay to go home with inhaler. Discussed return precautions with pt  I personally performed the services described in this documentation, which was scribed in my presence. The recorded information has been reviewed and is accurate.     Teressa LowerVrinda Golden Emile, NP 10/02/14 1549  Mancel BaleElliott Wentz, MD 10/02/14 [phone]

## 2014-10-02 NOTE — Discharge Instructions (Signed)
Follow up with your doctor or return here for continued or worsening symptoms Asthma Asthma is a condition of the lungs in which the airways tighten and narrow. Asthma can make it hard to breathe. Asthma cannot be cured, but medicine and lifestyle changes can help control it. Asthma may be started (triggered) by:  Animal skin flakes (dander).  Dust.  Cockroaches.  Pollen.  Mold.  Smoke.  Cleaning products.  Hair sprays or aerosol sprays.  Paint fumes or strong smells.  Cold air, weather changes, and winds.  Crying or laughing hard.  Stress.  Certain medicines or drugs.  Foods, such as dried fruit, potato chips, and sparkling grape juice.  Infections or conditions (colds, flu).  Exercise.  Certain medical conditions or diseases.  Exercise or tiring activities. HOME CARE   Take medicine as told by your doctor.  Use a peak flow meter as told by your doctor. A peak flow meter is a tool that measures how well the lungs are working.  Record and keep track of the peak flow meter's readings.  Understand and use the asthma action plan. An asthma action plan is a written plan for taking care of your asthma and treating your attacks.  To help prevent asthma attacks:  Do not smoke. Stay away from secondhand smoke.  Change your heating and air conditioning filter often.  Limit your use of fireplaces and wood stoves.  Get rid of pests (such as roaches and mice) and their droppings.  Throw away plants if you see mold on them.  Clean your floors. Dust regularly. Use cleaning products that do not smell.  Have someone vacuum when you are not home. Use a vacuum cleaner with a HEPA filter if possible.  Replace carpet with wood, tile, or vinyl flooring. Carpet can trap animal skin flakes and dust.  Use allergy-proof pillows, mattress covers, and box spring covers.  Wash bed sheets and blankets every week in hot water and dry them in a dryer.  Use blankets that are  made of polyester or cotton.  Clean bathrooms and kitchens with bleach. If possible, have someone repaint the walls in these rooms with mold-resistant paint. Keep out of the rooms that are being cleaned and painted.  Wash hands often. GET HELP IF:  You have make a whistling sound when breaking (wheeze), have shortness of breath, or have a cough even if taking medicine to prevent attacks.  The colored mucus you cough up (sputum) is thicker than usual.  The colored mucus you cough up changes from clear or white to yellow, green, gray, or bloody.  You have problems from the medicine you are taking such as:  A rash.  Itching.  Swelling.  Trouble breathing.  You need reliever medicines more than 2-3 times a week.  Your peak flow measurement is still at 50-79% of your personal best after following the action plan for 1 hour.  You have a fever. GET HELP RIGHT AWAY IF:   You seem to be worse and are not responding to medicine during an asthma attack.  You are short of breath even at rest.  You get short of breath when doing very little activity.  You have trouble eating, drinking, or talking.  You have chest pain.  You have a fast heartbeat.  Your lips or fingernails start to turn blue.  You are light-headed, dizzy, or faint.  Your peak flow is less than 50% of your personal best. MAKE SURE YOU:   Understand these instructions.  Will watch your condition.  Will get help right away if you are not doing well or get worse. Document Released: 11/08/2007 Document Revised: 10/06/2013 Document Reviewed: 12/19/2012 [facility] Patient Information 2015 [place], [place]. This information is not intended to replace advice given to you by your health care provider. Make sure you discuss any questions you have with your health care provider.

## 2014-10-02 NOTE — ED Notes (Signed)
Pt A&OX4, ambulatory at d/c with steady gait, NAD 

## 2014-10-02 NOTE — ED Notes (Signed)
Pt c/o asthma flare up that started yesterday morning while at work. Stated that she does not have an inhaler at home and was coming to see if she could get one. Pt also c/o "chest cramping". Pt stated that she has also been having a cough that started today and is nonproductive dry cough.

## 2014-10-20 ENCOUNTER — Other Ambulatory Visit: Payer: Self-pay | Admitting: Family Medicine

## 2014-10-20 DIAGNOSIS — R102 Pelvic and perineal pain: Secondary | ICD-10-CM

## 2014-10-26 ENCOUNTER — Other Ambulatory Visit: Payer: Self-pay | Admitting: Family Medicine

## 2014-10-26 DIAGNOSIS — R102 Pelvic and perineal pain: Secondary | ICD-10-CM

## 2014-10-29 ENCOUNTER — Ambulatory Visit
Admission: RE | Admit: 2014-10-29 | Discharge: 2014-10-29 | Disposition: A | Discharge status: Home / Self Care | Payer: 59 | Source: Ambulatory Visit | Attending: Family Medicine | Admitting: Family Medicine

## 2014-10-29 DIAGNOSIS — R102 Pelvic and perineal pain: Secondary | ICD-10-CM

## 2015-11-25 ENCOUNTER — Emergency Department (HOSPITAL_COMMUNITY)
Admission: EM | Admit: 2015-11-25 | Discharge: 2015-11-25 | Disposition: A | Discharge status: Home / Self Care | Payer: Self-pay | Attending: Emergency Medicine | Admitting: Emergency Medicine

## 2015-11-25 ENCOUNTER — Encounter (HOSPITAL_COMMUNITY): Payer: Self-pay | Admitting: Nurse Practitioner

## 2015-11-25 DIAGNOSIS — H1033 Unspecified acute conjunctivitis, bilateral: Secondary | ICD-10-CM | POA: Insufficient documentation

## 2015-11-25 DIAGNOSIS — J45909 Unspecified asthma, uncomplicated: Secondary | ICD-10-CM | POA: Insufficient documentation

## 2015-11-25 DIAGNOSIS — H109 Unspecified conjunctivitis: Secondary | ICD-10-CM

## 2015-11-25 HISTORY — DX: Other seasonal allergic rhinitis: J30.2

## 2015-11-25 MED ORDER — TETRACAINE HCL 0.5 % OP SOLN
1.0000 [drp] | Freq: Once | OPHTHALMIC | Status: AC
  Administered 2015-11-25: 2 [drp] via OPHTHALMIC
  Filled 2015-11-25: qty 2

## 2015-11-25 MED ORDER — FLUORESCEIN SODIUM 1 MG OP STRP
1.0000 | ORAL_STRIP | Freq: Once | OPHTHALMIC | Status: AC
  Administered 2015-11-25: 1 via OPHTHALMIC
  Filled 2015-11-25: qty 1

## 2015-11-25 MED ORDER — POLYMYXIN B-TRIMETHOPRIM 10000-0.1 UNIT/ML-% OP SOLN: 1.0000 [drp] | OPHTHALMIC | Status: DC

## 2015-11-25 NOTE — ED Notes (Signed)
She reports 2 day history of gritty/dry sensation in bilateral eyes, eye redness, swelling, and pain. 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 She also has a history of seasonal allergies but is not on any allergy medications

## 2015-11-25 NOTE — Discharge Instructions (Signed)
Conjunctivitis is commonly called "pink eye." Conjunctivitis can be caused by bacterial or viral infection, allergies, or injuries. There is usually redness of the lining of the eye, itching, discomfort, and sometimes discharge. Pink eye is very contagious and spreads by direct contact. Try to avoid rubbing eyes and wash hands often.  You may be given antibiotic eyedrops as part of your treatment. Before using your eye medicine, remove all drainage from the eye by washing gently with warm water and cotton balls.  Do not rub your eye. This increases the irritation and helps spread infection. Use separate towels from other household members. Wash your hands with soap and water before and after touching your eyes. Use cold compresses to reduce pain and sunglasses to relieve irritation from light. Do not wear contact lenses or wear eye makeup until the infection is gone.   SEEK MEDICAL CARE IF:  Your symptoms are not better after 3 days of treatment.  You have increased pain or trouble seeing.  The outer eyelids become very red or swollen.  You develop double vision or your vision becomes blurred or worsens in any way.  You have trouble moving your eyes.  You develop a severe headache, severe neck pain, or neck stiffness.  You develop repeated vomiting.  You have a fever or persistent symptoms for more than 72 hours.  You have a fever and your symptoms suddenly get worse.

## 2015-11-25 NOTE — ED Provider Notes (Signed)
[name]: [id number]     Arrival date & time 11/25/15  1105 History  By signing my name below, I, Tricia Ray, attest that this documentation has been prepared under the direction and in the presence of Morgan StanleyJamie Ward, PA-C.  Electronically Signed: Octavia HeirArianna Ray, ED Scribe. 11/25/2015. 1:31 PM.     Chief Complaint  Patient presents with  . Eye Problem     The history is provided by the patient. No language interpreter was used.   HPI Comments: Tricia GlowShaeka Ray is a 25 y.o. female who presents to the Emergency Department complaining of sudden onset, gradual worsening, moderate bilateral eye pain onset three days ago. Pt reports associated yellow drainage, redness, and swelling to both eyes. Pt says it started with her left eye and has moved to her right eye. Pt notes that her eyes are very in the morning after waking up. 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 She thought she was having allergies so she notes taking benadryl and applying clear eyes to alleviate her symptoms with no relief. She denies sore throat, fever, cough, and nasal congestion.  Past Medical History  Diagnosis Date  . Asthma   . Seasonal allergies    Past Surgical History  Procedure Laterality Date  . Hand surgery     History reviewed. No pertinent family history. Social History  Substance Use Topics  . Smoking status: Never Smoker   . Smokeless tobacco: None  . Alcohol Use: Yes   OB History    No data available     Review of Systems  Constitutional: Negative for fever.  HENT: Negative for congestion and sore throat.   Eyes: Positive for pain, discharge, redness and visual disturbance.  Respiratory: Negative for cough.       Allergies  Shrimp; Aleve; and Cherry  Home Medications   Prior to Admission medications   Medication Sig Start Date End Date Taking? Authorizing Provider  albuterol (PROVENTIL HFA;VENTOLIN HFA) 108 (90 BASE) MCG/ACT inhaler Inhale 1-2  puffs into the lungs every 6 (six) hours as needed for wheezing or shortness of breath. 10/02/14   Teressa LowerVrinda Pickering, NP  amoxicillin-clavulanate (AUGMENTIN) 875-125 MG per tablet Take 1 tablet by mouth 2 (two) times daily. One po bid x 7 days 05/28/13   Dahlia ClientHannah Muthersbaugh, PA-C  cetirizine (ZYRTEC) 10 MG tablet Take 10 mg by mouth daily as needed for allergies.    Historical Provider, MD  ibuprofen (ADVIL,MOTRIN) 200 MG tablet Take 400 mg by mouth daily as needed for pain.    Historical Provider, MD  metroNIDAZOLE (FLAGYL) 500 MG tablet Take 1 tablet (500 mg total) by mouth 2 (two) times daily. 01/21/13   Eber HongBrian Miller, MD  sulfamethoxazole-trimethoprim (SEPTRA DS) 800-160 MG per tablet Take 1 tablet by mouth every 12 (twelve) hours. 01/21/13   Eber HongBrian Miller, MD  traMADol (ULTRAM) 50 MG tablet Take 1 tablet (50 mg total) by mouth every 6 (six) hours as needed for pain. 01/21/13   Eber HongBrian Miller, MD  trimethoprim-polymyxin b (POLYTRIM) ophthalmic solution Place 1 drop into both eyes every 4 (four) hours. X 7-10 days. 11/25/15   Chase PicketJaime Pilcher Ward, PA-C   Triage vitals: BP 112/66 mmHg  Pulse 76  Temp(Src) 98 F (36.7 C) (Oral)  Resp 18  SpO2 100% Physical Exam  Constitutional: She is oriented to person, place, and time. She appears well-developed and well-nourished.  HENT:  Head: Normocephalic and atraumatic.  Mouth/Throat: Oropharynx is clear  and moist. No oropharyngeal exudate.  Eyes: EOM are normal. Pupils are equal, round, and reactive to light. Lids are everted and swept, no foreign bodies found.  Slit lamp exam:      The right eye shows no corneal abrasion, no foreign body and no fluorescein uptake.       The left eye shows no corneal abrasion, no foreign body and no fluorescein uptake.  Bilateral conjunctival inject with left greater than right.   Neck: Normal range of motion.  Cardiovascular: Normal rate and regular rhythm.   Pulmonary/Chest: Effort normal and breath sounds normal. No  respiratory distress. She has no wheezes. She has no rales.  Abdominal: She exhibits no distension.  Musculoskeletal: Normal range of motion.  Neurological: She is alert and oriented to person, place, and time.  Psychiatric: She has a normal mood and affect.  Nursing note and vitals reviewed.   ED Course  Procedures  DIAGNOSTIC STUDIES: Oxygen Saturation is 100% on RA, normal by my interpretation.  COORDINATION OF CARE:  1:31 PM Discussed treatment plan with pt at bedside and pt agreed to plan.   Labs Review Labs Reviewed - No data to display  Imaging Review No results found. I have personally reviewed and evaluated these images and lab results as part of my medical decision-making.   EKG Interpretation None      MDM   Final diagnoses:  Bilateral conjunctivitis   Abaigeal Ray presents to ED for worsening red eyes with discharge x 2 days. Patient states she works in an area with dust particles in the air, therefore stained bilateral eyes to rule out abrasion. No fluorescein uptake seen. No signs of orbital cellulitis. Will treat with Polytrim. Follow-up with ophthalmology if no improvement in symptoms in 2-3 days. Symptomatic home care instructions given. Return precautions given. All questions answered.  I personally performed the services described in this documentation, which was scribed in my presence. The recorded information has been reviewed and is accurate.  [facility] Hospital AssociationJaime Pilcher Ward, PA-C 11/25/15 1701  Bethann BerkshireJoseph Zammit, MD 11/27/15 [phone]

## 2015-11-29 ENCOUNTER — Emergency Department (HOSPITAL_COMMUNITY)
Admission: EM | Admit: 2015-11-29 | Discharge: 2015-11-29 | Disposition: A | Discharge status: Home / Self Care | Payer: Self-pay

## 2015-11-29 ENCOUNTER — Ambulatory Visit (HOSPITAL_COMMUNITY)
Admission: EM | Admit: 2015-11-29 | Discharge: 2015-11-29 | Disposition: A | Discharge status: Home / Self Care | Payer: Self-pay | Attending: Family Medicine | Admitting: Family Medicine

## 2015-11-29 ENCOUNTER — Encounter (HOSPITAL_COMMUNITY): Payer: Self-pay

## 2015-11-29 DIAGNOSIS — H1013 Acute atopic conjunctivitis, bilateral: Secondary | ICD-10-CM

## 2015-11-29 MED ORDER — GENTAMICIN SULFATE 0.3 % OP SOLN: 1.0000 [drp] | OPHTHALMIC | Status: AC

## 2015-11-29 NOTE — ED Notes (Signed)
Discharged by Barbra SarksFrank Patrick, PA

## 2015-11-29 NOTE — ED Notes (Addendum)
Patient presents with possible pink eye, pt went to ED on 11/25/2015 she was given polymyxin, she has applied cold packs and eye drops No acute distress

## 2015-11-29 NOTE — Discharge Instructions (Signed)
Allergic Conjunctivitis Allergic conjunctivitis is inflammation of the clear membrane that covers the white part of your eye and the inner surface of your eyelid (conjunctiva), and it is caused by allergies. The blood vessels in the conjunctiva become inflamed, and this causes the eye to become red or pink, and it often causes itchiness in the eye. Allergic conjunctivitis cannot be spread by one person to another person (noncontagious). CAUSES This condition is caused by an allergic reaction. Common causes of an allergic reaction (allergens) include:  Dust.  Pollen.  Mold.  Animal dander or secretions. RISK FACTORS This condition is more likely to develop if you are exposed to high levels of allergens that cause the allergic reaction. This might include being outdoors when air pollen levels are high or being around animals that you are allergic to. SYMPTOMS Symptoms of this condition may include:  Eye redness.  Tearing of the eyes.  Watery eyes.  Itchy eyes.  Burning feeling in the eyes.  Clear drainage from the eyes.  Swollen eyelids. DIAGNOSIS This condition may be diagnosed by medical history and physical exam. If you have drainage from your eyes, it may be tested to rule out other causes of conjunctivitis. TREATMENT Treatment for this condition often includes medicines. These may be eye drops, ointments, or oral medicines. They may be prescription medicines or over-the-counter medicines. HOME CARE INSTRUCTIONS  Take or apply medicines only as directed by your health care provider.  Do not touch or rub your eyes.  Do not wear contact lenses until the inflammation is gone. Wear glasses instead.  Do not wear eye makeup until the inflammation is gone.  Apply a cool, clean washcloth to your eye for 10-20 minutes, 3-4 times a day.  Try to avoid whatever allergen is causing the allergic reaction. SEEK MEDICAL CARE IF:  Your symptoms get worse.  You have pus draining  from your eye.  You have new symptoms.  You have a fever.   This information is not intended to replace advice given to you by your health care provider. Make sure you discuss any questions you have with your health care provider.   Document Released: 08/12/2002 Document Revised: 06/12/2014 Document Reviewed: 03/03/2014 Elsevier Interactive Patient Education 2016 [employer]  

## 2015-11-29 NOTE — ED Provider Notes (Signed)
[name]: [id number]     Arrival date & time 11/29/15  1548 History   First MD Initiated Contact with Patient 11/29/15 1641     Chief Complaint  Patient presents with  . Conjunctivitis   (Consider location/radiation/quality/duration/timing/severity/associated sxs/prior Treatment) HPI History obtained from patient: Location:  Both eyes Context/Duration: 1 week sudden onset unsure of the cause  Severity: No pain but some itching  Quality: Itching Timing:        Constant    Home Treatment: Was being treated with Polytrim sulfamethoxazole drops. Associated symptoms:  Tearing Family History: Hypertension    Past Medical History  Diagnosis Date  . Asthma   . Seasonal allergies    Past Surgical History  Procedure Laterality Date  . Hand surgery     No family history on file. Social History  Substance Use Topics  . Smoking status: Never Smoker   . Smokeless tobacco: Never Used  . Alcohol Use: Yes   OB History    No data available     Review of Systems  Denies: HEADACHE, NAUSEA, ABDOMINAL PAIN, CHEST PAIN, CONGESTION, DYSURIA, SHORTNESS OF BREATH  Allergies  Shrimp; Aleve; and Cherry  Home Medications   Prior to Admission medications   Medication Sig Start Date End Date Taking? Authorizing Provider  ibuprofen (ADVIL,MOTRIN) 200 MG tablet Take 400 mg by mouth daily as needed for pain.   Yes Historical Provider, MD  trimethoprim-polymyxin b (POLYTRIM) ophthalmic solution Place 1 drop into both eyes every 4 (four) hours. X 7-10 days. 11/25/15  Yes Jaime Pilcher Ward, PA-C  albuterol (PROVENTIL HFA;VENTOLIN HFA) 108 (90 BASE) MCG/ACT inhaler Inhale 1-2 puffs into the lungs every 6 (six) hours as needed for wheezing or shortness of breath. 10/02/14   Teressa LowerVrinda Pickering, NP  amoxicillin-clavulanate (AUGMENTIN) 875-125 MG per tablet Take 1 tablet by mouth 2 (two) times daily. One po bid x 7 days 05/28/13   Dahlia ClientHannah Muthersbaugh, PA-C  cetirizine (ZYRTEC) 10 MG tablet Take 10 mg by mouth  daily as needed for allergies.    Historical Provider, MD  gentamicin (GARAMYCIN) 0.3 % ophthalmic solution Place 1 drop into both eyes every 4 (four) hours. 11/29/15 12/04/15  Tharon AquasFrank C Patrick, PA  metroNIDAZOLE (FLAGYL) 500 MG tablet Take 1 tablet (500 mg total) by mouth 2 (two) times daily. 01/21/13   Eber HongBrian Miller, MD  sulfamethoxazole-trimethoprim (SEPTRA DS) 800-160 MG per tablet Take 1 tablet by mouth every 12 (twelve) hours. 01/21/13   Eber HongBrian Miller, MD  traMADol (ULTRAM) 50 MG tablet Take 1 tablet (50 mg total) by mouth every 6 (six) hours as needed for pain. 01/21/13   Eber HongBrian Miller, MD   Meds Ordered and Administered this Visit  Medications - No data to display  BP 121/82 mmHg  Pulse 71  Temp(Src) 98.4 F (36.9 C) (Oral)  Resp 15  SpO2 98%  LMP 11/27/2015 (Exact Date) No data found.   Physical Exam NURSES NOTES AND VITAL SIGNS REVIEWED. CONSTITUTIONAL: Well developed, well nourished, no acute distress HEENT: normocephalic, atraumatic EYES: Both conjunctivae are fire engine red without matting or discharge noted in the lashes. There is no follicular swelling of the conjunctiva noted. No chemosis.  NECK:normal ROM, supple, no adenopathy PULMONARY:No respiratory distress, normal effort ABDOMINAL: Soft, ND, NT BS+, No CVAT MUSCULOSKELETAL: Normal ROM of all extremities,  SKIN: warm and dry without rash PSYCHIATRIC: Mood and affect, behavior are normal  ED Course  Procedures (including critical care time)  Labs Review Labs Reviewed - No data to display  Imaging Review No results found.   Visual Acuity Review  Right Eye Distance: 20/25 Left Eye Distance: 20/40 Bilateral Distance: 20/25  Right Eye Near:   Left Eye Near:    Bilateral Near:      Patient's eye condition is most consistent with allergic conjunctivitis instead of a bacterial process. I have advised her to hold the antibiotic drops and not put anything in her eyes for 24 hours. For her symptoms are steadily  been getting better she does not need any additional antibiotic drops. A return to work note has been provided. Prescription for gentamicin ophthalmic drops have been given in case she needs it.   MDM   1. Allergic conjunctivitis of both eyes     Patient is reassured that there are no issues that require transfer to higher level of care at this time or additional tests. Patient is advised to continue home symptomatic treatment. Patient is advised that if there are new or worsening symptoms to attend the emergency department, contact primary care provider, or return to UC. Instructions of care provided discharged home in stable condition.    THIS NOTE WAS GENERATED USING A VOICE RECOGNITION SOFTWARE PROGRAM. ALL REASONABLE EFFORTS  WERE MADE TO PROOFREAD THIS DOCUMENT FOR ACCURACY.  I have verbally reviewed the discharge instructions with the patient. A printed AVS was given to the patient.  All questions were answered prior to discharge.      Tharon AquasFrank C Patrick, PA 11/29/15 1835

## 2015-11-29 NOTE — ED Notes (Signed)
Called patient in main waiting room with no answer.  Called again and patient appeared from front door stating she had already cut her arm band off and was going to urgent care.  Advised patient patient to step over to registration to get another arm band and I will take her back.  Patient left the waiting room without getting another arm band.

## 2016-02-13 ENCOUNTER — Encounter (HOSPITAL_COMMUNITY): Payer: Self-pay | Admitting: Emergency Medicine

## 2016-02-13 ENCOUNTER — Emergency Department (HOSPITAL_COMMUNITY)
Admission: EM | Admit: 2016-02-13 | Discharge: 2016-02-13 | Disposition: A | Discharge status: Home / Self Care | Payer: Self-pay | Attending: Emergency Medicine | Admitting: Emergency Medicine

## 2016-02-13 DIAGNOSIS — Z5321 Procedure and treatment not carried out due to patient leaving prior to being seen by health care provider: Secondary | ICD-10-CM | POA: Insufficient documentation

## 2016-02-13 DIAGNOSIS — R197 Diarrhea, unspecified: Secondary | ICD-10-CM | POA: Insufficient documentation

## 2016-02-13 DIAGNOSIS — J45909 Unspecified asthma, uncomplicated: Secondary | ICD-10-CM | POA: Insufficient documentation

## 2016-02-13 DIAGNOSIS — R112 Nausea with vomiting, unspecified: Secondary | ICD-10-CM | POA: Insufficient documentation

## 2016-02-13 LAB — COMPREHENSIVE METABOLIC PANEL
ALT: 16 U/L (ref 14–54)
AST: 18 U/L (ref 15–41)
Albumin: 3.2 g/dL — ABNORMAL LOW (ref 3.5–5.0)
Alkaline Phosphatase: 65 U/L (ref 38–126)
Anion gap: 6 (ref 5–15)
BILIRUBIN TOTAL: 0.2 mg/dL — AB (ref 0.3–1.2)
BUN: <5 mg/dL — ABNORMAL LOW (ref 6–20)
CHLORIDE: 110 mmol/L (ref 101–111)
CO2: 24 mmol/L (ref 22–32)
Calcium: 9.1 mg/dL (ref 8.9–10.3)
Creatinine, Ser: 0.9 mg/dL (ref 0.44–1.00)
Glucose, Bld: 105 mg/dL — ABNORMAL HIGH (ref 65–99)
POTASSIUM: 4 mmol/L (ref 3.5–5.1)
Sodium: 140 mmol/L (ref 135–145)
TOTAL PROTEIN: 6.9 g/dL (ref 6.5–8.1)

## 2016-02-13 LAB — CBC
HEMATOCRIT: 41.2 % (ref 36.0–46.0)
Hemoglobin: 12.8 g/dL (ref 12.0–15.0)
MCH: 26.7 pg (ref 26.0–34.0)
MCHC: 31.1 g/dL (ref 30.0–36.0)
MCV: 86 fL (ref 78.0–100.0)
Platelets: 250 10*3/uL (ref 150–400)
RBC: 4.79 MIL/uL (ref 3.87–5.11)
RDW: 14.2 % (ref 11.5–15.5)
WBC: 6.3 10*3/uL (ref 4.0–10.5)

## 2016-02-13 LAB — LIPASE, BLOOD: LIPASE: 18 U/L (ref 11–51)

## 2016-02-13 LAB — I-STAT BETA HCG BLOOD, ED (MC, WL, AP ONLY): I-stat hCG, quantitative: <5 m[IU]/mL (ref <5)

## 2016-02-13 NOTE — ED Triage Notes (Signed)
Pt sts N/V/D starting today 

## 2016-02-13 NOTE — ED Notes (Signed)
No answer x3

## 2016-02-13 NOTE — ED Notes (Signed)
No answer x2 

## 2016-06-29 IMAGING — US US TRANSVAGINAL NON-OB
1 series · 14 of 25 positions shown · non-contrast
Comparison: None

CLINICAL DATA: Pelvic pain x1 week



[Series 1: us transvaginal non-ob · 0.22mm/px · 14 of 73 slices shown]
[im 1/73]
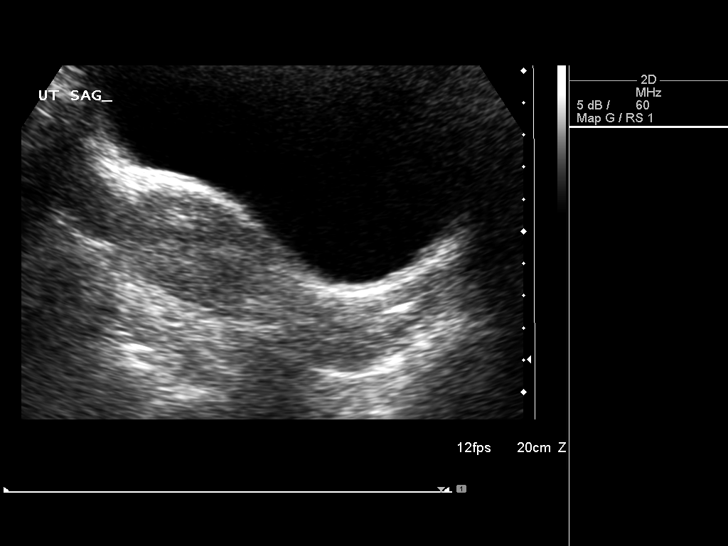
[im 7/73]
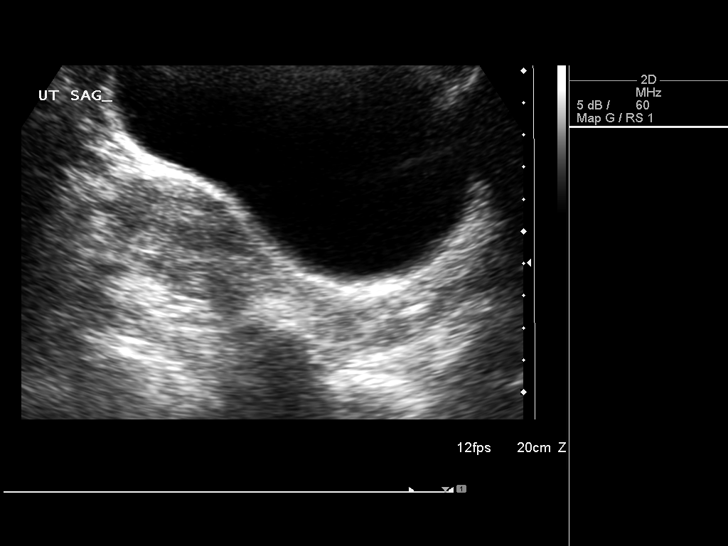
[im 13/73]
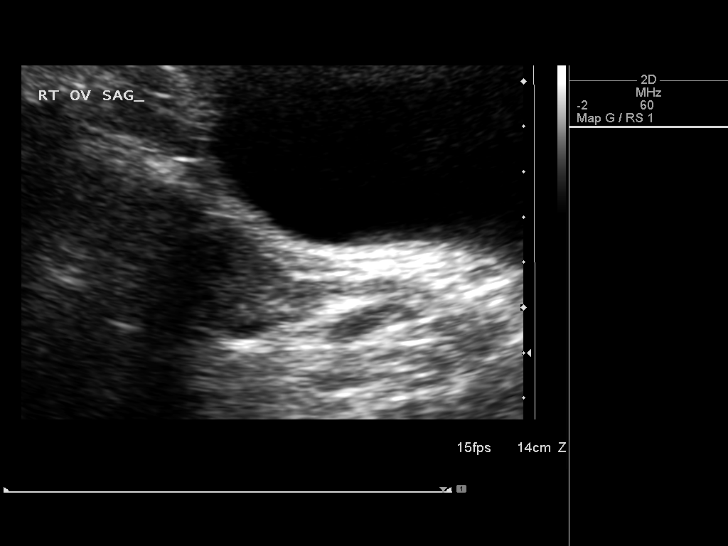
[im 19/73]
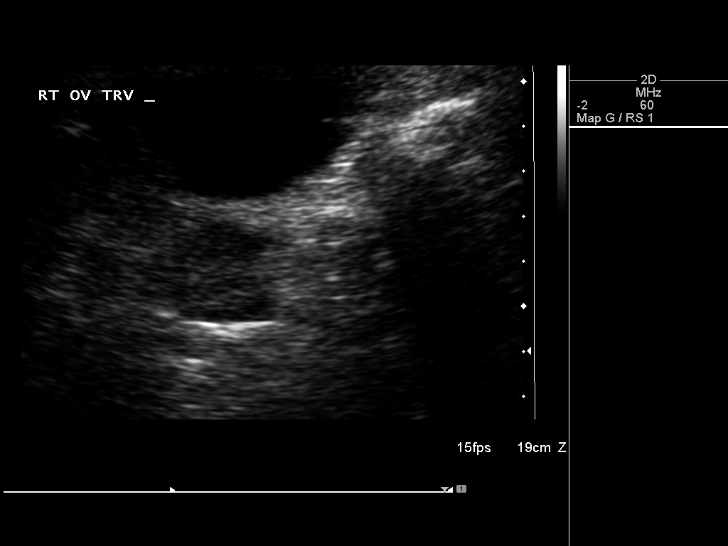
[im 25/73]
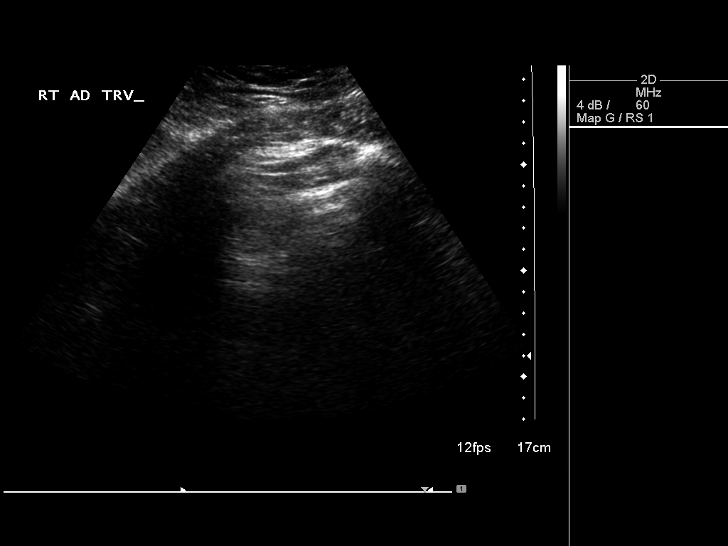
[im 28/73]
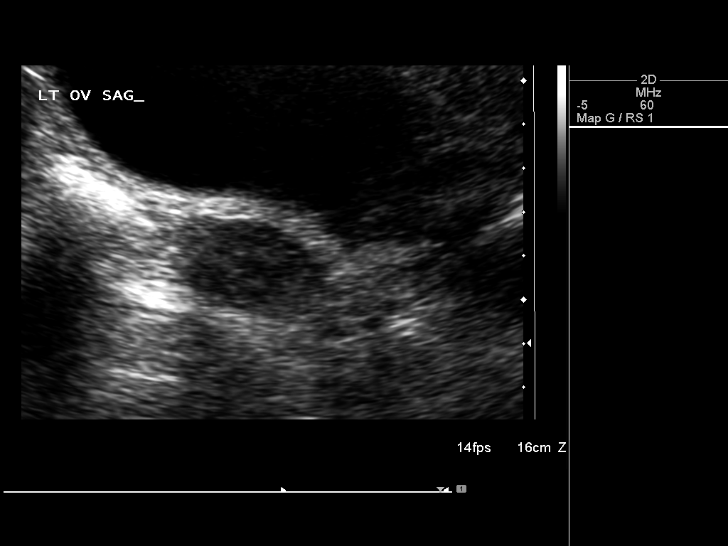
[im 34/73]
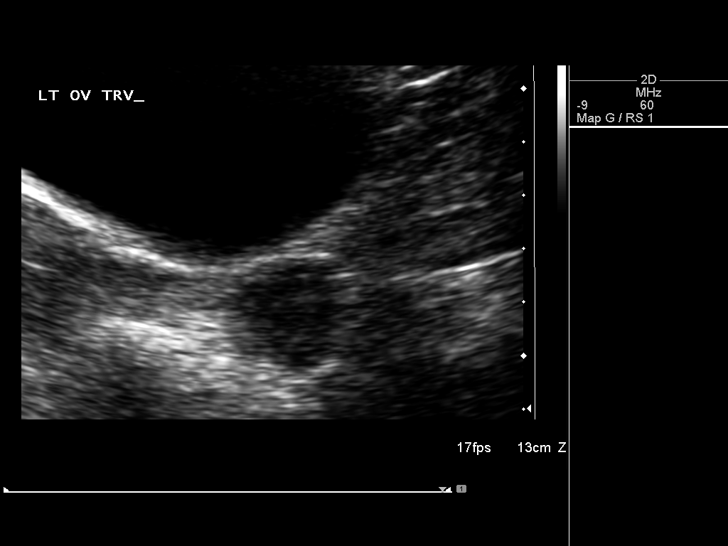
[im 40/73]
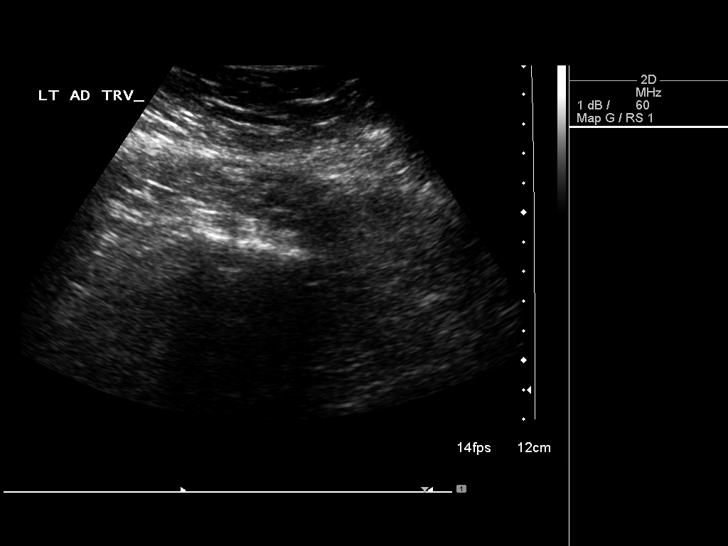
[im 46/73]
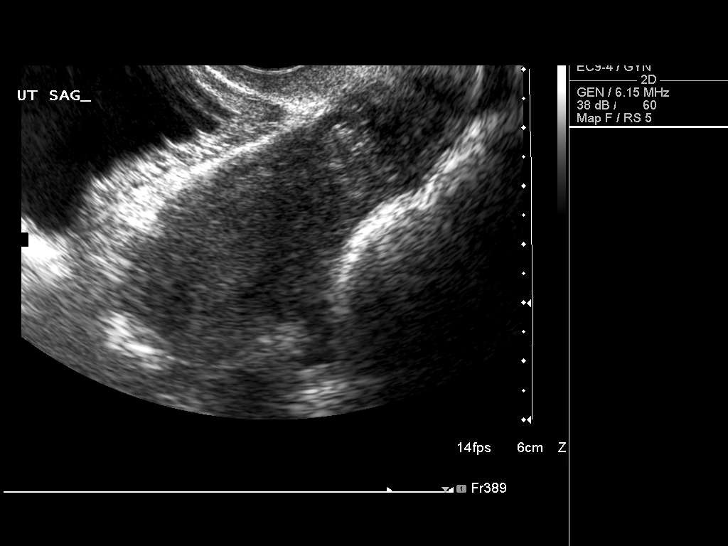
[im 49/73]
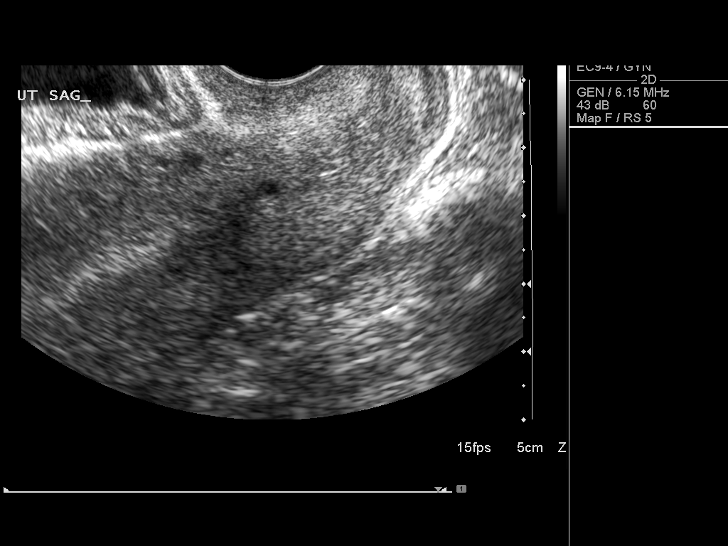
[im 55/73]
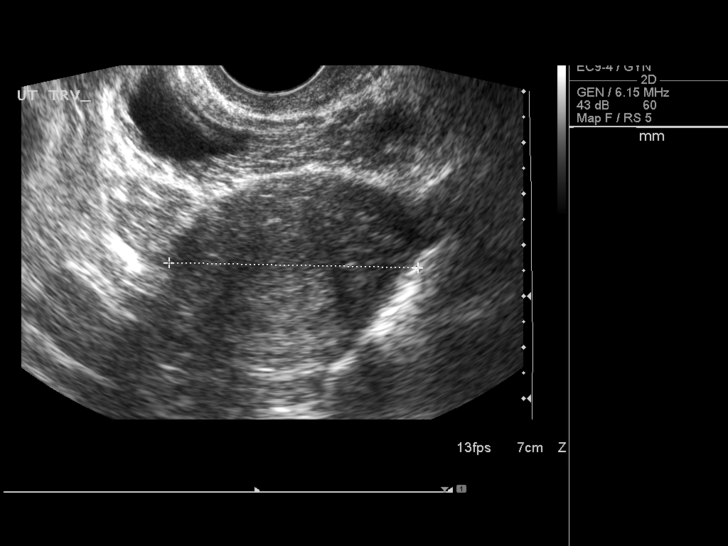
[im 61/73]
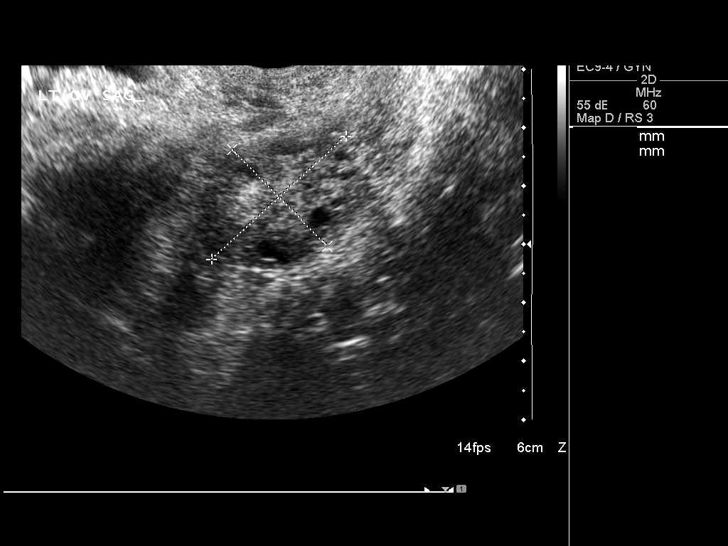
[im 67/73]
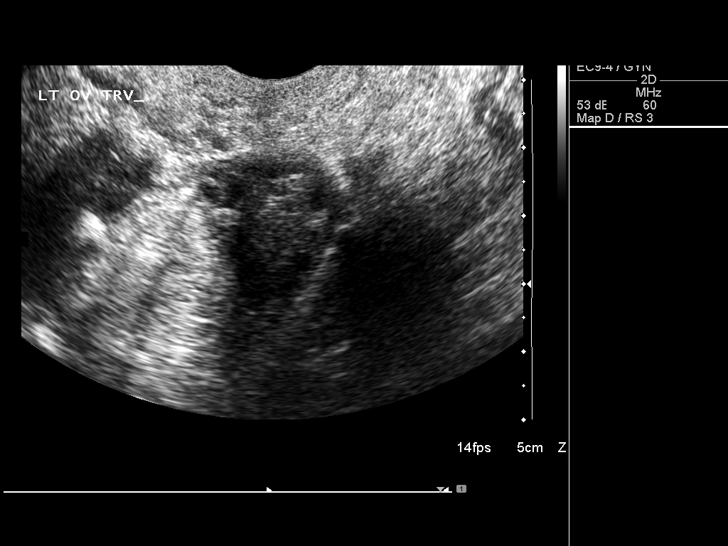
[im 73/73]
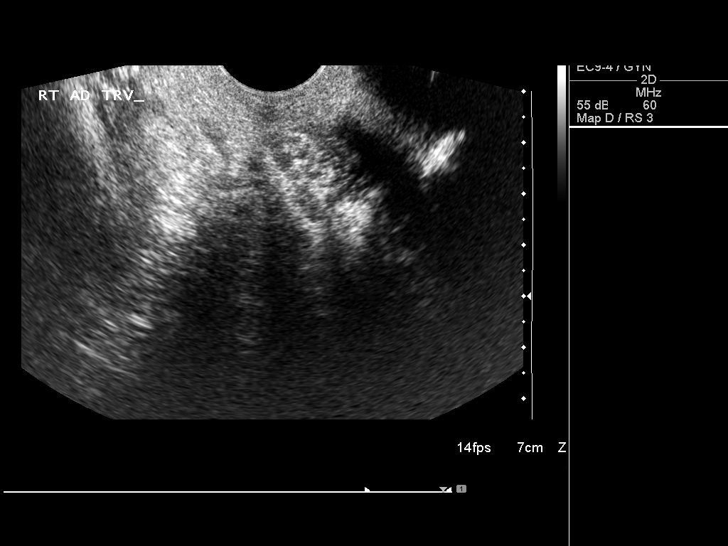

[14 of 25 positions shown; findings below may reference images not displayed]

FINDINGS: Uterus

Measurements: 9.2 x 3.2 x 4.8 cm. No fibroids or other mass
visualized.

Endometrium

Thickness: 11 mm.  No focal abnormality visualized.

Right ovary

Measurements: 3.6 x 2.1 x 2.6 cm. Normal appearance/no adnexal mass.

Left ovary

Measurements: 3.1 x 2.3 x 2.1 cm. Normal appearance/no adnexal mass.

Other findings

No free fluid.
IMPRESSION: Negative pelvic ultrasound.

## 2018-01-18 ENCOUNTER — Encounter (HOSPITAL_COMMUNITY): Payer: Self-pay | Admitting: Emergency Medicine

## 2018-01-18 ENCOUNTER — Emergency Department (HOSPITAL_COMMUNITY)
Admission: EM | Admit: 2018-01-18 | Discharge: 2018-01-18 | Disposition: A | Discharge status: Home / Self Care | Payer: Self-pay | Attending: Emergency Medicine | Admitting: Emergency Medicine

## 2018-01-18 DIAGNOSIS — Y999 Unspecified external cause status: Secondary | ICD-10-CM | POA: Insufficient documentation

## 2018-01-18 DIAGNOSIS — S0181XA Laceration without foreign body of other part of head, initial encounter: Secondary | ICD-10-CM | POA: Insufficient documentation

## 2018-01-18 DIAGNOSIS — Y9301 Activity, walking, marching and hiking: Secondary | ICD-10-CM | POA: Insufficient documentation

## 2018-01-18 DIAGNOSIS — J45909 Unspecified asthma, uncomplicated: Secondary | ICD-10-CM | POA: Insufficient documentation

## 2018-01-18 DIAGNOSIS — Y929 Unspecified place or not applicable: Secondary | ICD-10-CM | POA: Insufficient documentation

## 2018-01-18 DIAGNOSIS — Z79899 Other long term (current) drug therapy: Secondary | ICD-10-CM | POA: Insufficient documentation

## 2018-01-18 DIAGNOSIS — W010XXA Fall on same level from slipping, tripping and stumbling without subsequent striking against object, initial encounter: Secondary | ICD-10-CM | POA: Insufficient documentation

## 2018-01-18 MED ORDER — ACETAMINOPHEN 500 MG PO TABS
1000.0000 mg | ORAL_TABLET | Freq: Once | ORAL | Status: AC
  Administered 2018-01-18: 1000 mg via ORAL
  Filled 2018-01-18: qty 2

## 2018-01-18 MED ORDER — CHLORHEXIDINE GLUCONATE 0.12 % MT SOLN: 15.0000 mL | Freq: Two times a day (BID) | OROMUCOSAL | 0 refills | Status: DC

## 2018-01-18 MED ORDER — LIDOCAINE-EPINEPHRINE (PF) 2 %-1:200000 IJ SOLN
10.0000 mL | Freq: Once | INTRAMUSCULAR | Status: AC
  Administered 2018-01-18: 10 mL
  Filled 2018-01-18: qty 20

## 2018-01-18 MED ORDER — TETANUS-DIPHTH-ACELL PERTUSSIS 5-2.5-18.5 LF-MCG/0.5 IM SUSP
0.5000 mL | Freq: Once | INTRAMUSCULAR | Status: AC
  Administered 2018-01-18: 0.5 mL via INTRAMUSCULAR
  Filled 2018-01-18: qty 0.5

## 2018-01-18 NOTE — ED Provider Notes (Signed)
Patient placed in Quick Look pathway, seen and evaluated   Chief Complaint: Fall, facial laceration  HPI: Patient tripped over a curb and struck her chin on the ground earlier this evening.  No loss of consciousness or neck pain.  Patient felt a little dizzy however this is improved.  No vision changes or vomiting.  Unknown last tetanus.  The area bled a lot initially.  Patient noted a "hole" in her skin.  No teeth are loose.  She is moving her jaw without pain.  ROS:  Positive ROS: (+) Laceration Negative ROS: (-) Headache  Physical Exam:   Gen: No distress  Neuro: Awake and Alert  Skin: Warm    Focused Exam: ENT approximately 1cm laceration to the chin, gaping, not actively bleeding, abrasions noted of the inner lower lip without laceration , no point tenderness over the mandible or step-offs; heart RRR, nml S1,S2, no m/r/g; Lungs CTAB; Abd soft, NT, no rebound or guarding; Ext 2+ pedal pulses bilaterally, no edema.  BP 130/85 (BP Location: Right Arm)   Pulse 73   Temp 98.2 F (36.8 C) (Oral)   Resp 16   Ht 5\' 7"  (1.702 m)   Wt (!) 149.7 kg   LMP 01/09/2018   SpO2 100%   BMI 51.69 kg/m   Plan: No indications for head imaging at this time.  Patient will need wound repaired, tetanus update.  Initiation of care has begun. The patient has been counseled on the process, plan, and necessity for staying for the completion/evaluation, and the remainder of the medical screening examination    Desmond DikeGeiple, Kesha Hurrell, PA-C 01/18/18 2043    Gerhard MunchLockwood, Robert, MD 01/18/18 2103

## 2018-01-18 NOTE — ED Triage Notes (Signed)
Patient tripped and fell this evening hit her chin against the ground , denies LOC/ambulatory , reports pain at lower chin with laceration at right upper chin approx. 1/2" with mild bleeding .

## 2018-01-18 NOTE — Discharge Instructions (Signed)
Keep the wound clean and dry for the first 24 hours. After that you may gently clean the wound with soap and water. Make sure to pat dry the wound before covering it with any dressing. You can use topical antibiotic ointment and bandage. Ice and elevate for pain relief.   You can take Tylenol or Ibuprofen as directed for pain. You can alternate Tylenol and Ibuprofen every 4 hours for additional pain relief.   Use peridex mouthwash as directed.   Return to the Emergency Department, your primary care doctor, or the [facility] in 5 days for suture removal.   Monitor closely for any signs of infection. Return to the Emergency Department for any worsening redness/swelling of the area that begins to spread, drainage from the site, worsening pain, fever or any other worsening or concerning symptoms.

## 2018-01-18 NOTE — ED Provider Notes (Signed)
[facility] EM[facility]ENC[facility] Provider Note   [name]: [id number] Arrival date & time: 01/18/18  1941     History   Chief Complaint Chief Complaint  Patient presents with  . Chin Injury    HPI Tricia Ray is a 27 y.o. female who presents for evaluation of chin laceration that occurred just prior to ED arrival.  Patient reports that she was moving some boxes and states that she tripped over her flip-flops, causing her to fall forward and struck her chin on the ground.  Patient states she had no LOC.  She was able to get up immediately afterwards.  Patient reports that she has a laceration noted to the anterior aspect of her chin where she thinks her teeth hit it.  Patient states that she has not had any vomiting, difficulty breathing since the incident.  Patient feels like her dentition lines up normally.  Patient denies any chest pain, vision changes.  She does not know when her last tetanus shot was.  The history is provided by the patient.    Past Medical History:  Diagnosis Date  . Asthma   . Seasonal allergies     There are no active problems to display for this patient.   Past Surgical History:  Procedure Laterality Date  . HAND SURGERY       OB History   None      Home Medications    Prior to Admission medications   Medication Sig Start Date End Date Taking? Authorizing Provider  albuterol (PROVENTIL HFA;VENTOLIN HFA) 108 (90 BASE) MCG/ACT inhaler Inhale 1-2 puffs into the lungs every 6 (six) hours as needed for wheezing or shortness of breath. 10/02/14   Teressa LowerPickering, Vrinda, NP  amoxicillin-clavulanate (AUGMENTIN) 875-125 MG per tablet Take 1 tablet by mouth 2 (two) times daily. One po bid x 7 days 05/28/13   Muthersbaugh, Dahlia ClientHannah, PA-C  cetirizine (ZYRTEC) 10 MG tablet Take 10 mg by mouth daily as needed for allergies.    [provider]  chlorhexidine (PERIDEX) 0.12 % solution Use as directed 15 mLs in the mouth or throat 2 (two) times  daily. 01/18/18   Maxwell CaulLayden, Salah Burlison A, PA-C  ibuprofen (ADVIL,MOTRIN) 200 MG tablet Take 400 mg by mouth daily as needed for pain.    [provider]  metroNIDAZOLE (FLAGYL) 500 MG tablet Take 1 tablet (500 mg total) by mouth 2 (two) times daily. 01/21/13   Eber HongMiller, Brian, MD  sulfamethoxazole-trimethoprim (SEPTRA DS) 800-160 MG per tablet Take 1 tablet by mouth every 12 (twelve) hours. 01/21/13   Eber HongMiller, Brian, MD  traMADol (ULTRAM) 50 MG tablet Take 1 tablet (50 mg total) by mouth every 6 (six) hours as needed for pain. 01/21/13   Eber HongMiller, Brian, MD  trimethoprim-polymyxin b (POLYTRIM) ophthalmic solution Place 1 drop into both eyes every 4 (four) hours. X 7-10 days. 11/25/15   Ward, Chase PicketJaime Pilcher, PA-C    Family History No family history on file.  Social History Social History   Tobacco Use  . Smoking status: Never Smoker  . Smokeless tobacco: Never Used  Substance Use Topics  . Alcohol use: Yes  . Drug use: Yes    Frequency: 2.0 times per week    Types: Marijuana     Allergies   Shrimp [shellfish allergy]; Aleve [naproxen sodium]; Cherry; and Penicillins   Review of Systems Review of Systems  Eyes: Negative for visual disturbance.  Respiratory: Negative for shortness of breath.   Cardiovascular: Negative for chest pain.  Gastrointestinal: Negative for vomiting.  Skin: Positive for wound.     Physical Exam Updated Vital Signs BP 130/85 (BP Location: Right Arm)   Pulse 73   Temp 98.2 F (36.8 C) (Oral)   Resp 16   Ht 5\' 7"  (1.702 m)   Wt (!) 149.7 kg   LMP 01/09/2018   SpO2 100%   BMI 51.69 kg/m   Physical Exam  Constitutional: She appears well-developed and well-nourished.  HENT:  Head: Normocephalic and atraumatic.    Dentition appears to be intact.  No tenderness palpation noted of mandible.  No deformity or crepitus noted.  Elevation/depression and lateral movement of mandible intact without any difficulty.  Airways patent, phonation is intact.  Small  laceration to the anterior aspect of the right chin.  Does not appear to go through and through.  Eyes: Conjunctivae and EOM are normal. Right eye exhibits no discharge. Left eye exhibits no discharge. No scleral icterus.  Neck: Full passive range of motion without pain.  Full flexion/extension and lateral movement of neck fully intact. No bony midline tenderness. No deformities or crepitus.   Pulmonary/Chest: Effort normal.  Neurological: She is alert.  Skin: Skin is warm and dry.  Psychiatric: She has a normal mood and affect. Her speech is normal and behavior is normal.  Nursing note and vitals reviewed.    ED Treatments / Results  Labs (all labs ordered are listed, but only abnormal results are displayed) Labs Reviewed - No data to display  EKG None  Radiology No results found.  Procedures .Marland KitchenLaceration Repair Date/Time: 01/18/2018 9:23 PM Performed by: Maxwell Caul, PA-C Authorized by: Maxwell Caul, PA-C   Consent:    Consent obtained:  Verbal   Consent given by:  Patient   Risks discussed:  Infection, pain and poor cosmetic result Anesthesia (see MAR for exact dosages):    Anesthesia method:  Local infiltration   Local anesthetic:  Lidocaine 2% WITH epi Laceration details:    Location:  Face   Face location:  Chin   Length (cm):  1 Repair type:    Repair type:  Simple Pre-procedure details:    Preparation:  Patient was prepped and draped in usual sterile fashion Exploration:    Hemostasis achieved with:  Direct pressure Treatment:    Area cleansed with:  Betadine   Amount of cleaning:  Standard   Irrigation solution:  Sterile saline   Irrigation method:  Syringe   Visualized foreign bodies/material removed: no   Skin repair:    Repair method:  Sutures   Suture size:  6-0   Suture material:  Nylon   Suture technique:  Simple interrupted   Number of sutures:  3 Approximation:    Approximation:  Close Post-procedure details:    Dressing:   Antibiotic ointment and sterile dressing Comments:     Once the wound was anesthetized, full exploration of the wound revealed no evidence of foreign bodies.  A sterile Q-tip was used to explore the wound did not appear to go through and through.  There is a small abrasion noted on the inner gumline but does not appear to be an open laceration that extends into the anterior wound.   (including critical care time)  Medications Ordered in ED Medications  Tdap (BOOSTRIX) injection 0.5 mL (has no administration in time range)  acetaminophen (TYLENOL) tablet 1,000 mg (has no administration in time range)  lidocaine-EPINEPHrine (XYLOCAINE W/EPI) 2 %-1:200000 (PF) injection 10 mL (10 mLs Infiltration Given by  Other 01/18/18 2156)     Initial Impression / Assessment and Plan / ED Course  I have reviewed the triage vital signs and the nursing notes.  Pertinent labs & imaging results that were available during my care of the patient were reviewed by me and considered in my medical decision making (see chart for details).     27 year old female who presents for evaluation of wound to chin that occurred after mechanical fall earlier this evening.  No LOC, head injury.  Sustained a 1 cm laceration noted to the anterior aspect of chin.  Dentition appears to be intact.  Expiration of the wound does not show that it goes through and through.  Given depth of wound, will need suture repair.  Patient's tetanus is not up-to-date.  No indication for imaging at this time as patient does exhibit any tenderness.  Laceration repaired as documented above.  Once the wound was anesthetized, full expiration today did not go through and through.  No foreign bodies noted.  We will plan to start patient on Peridex mouthwash for abrasions on the inner aspect of the lip.  Patient instructed on wound care precautions. Patient had ample opportunity for questions and discussion. All patient's questions were answered with full  understanding. Strict return precautions discussed. Patient expresses understanding and agreement to plan.   Final Clinical Impressions(s) / ED Diagnoses   Final diagnoses:  Chin laceration, initial encounter    ED Discharge Orders         Ordered    chlorhexidine (PERIDEX) 0.12 % solution  2 times daily,   Status:  Discontinued     01/18/18 2156    chlorhexidine (PERIDEX) 0.12 % solution  2 times daily     01/18/18 2157           Maxwell CaulLayden, Kateryn Marasigan A, PA-C 01/18/18 2200    Tegeler, Canary Brimhristopher J, MD 01/19/18 [phone]

## 2018-01-26 ENCOUNTER — Ambulatory Visit (HOSPITAL_COMMUNITY): Admission: EM | Admit: 2018-01-26 | Discharge: 2018-01-26 | Discharge status: Absent without leave | Payer: Self-pay

## 2018-01-26 ENCOUNTER — Emergency Department (HOSPITAL_COMMUNITY)
Admission: EM | Admit: 2018-01-26 | Discharge: 2018-01-26 | Disposition: A | Discharge status: Home / Self Care | Payer: Self-pay | Attending: Emergency Medicine | Admitting: Emergency Medicine

## 2018-01-26 ENCOUNTER — Other Ambulatory Visit: Payer: Self-pay

## 2018-01-26 ENCOUNTER — Encounter (HOSPITAL_COMMUNITY): Payer: Self-pay | Admitting: Emergency Medicine

## 2018-01-26 DIAGNOSIS — J45909 Unspecified asthma, uncomplicated: Secondary | ICD-10-CM | POA: Insufficient documentation

## 2018-01-26 DIAGNOSIS — Z4802 Encounter for removal of sutures: Secondary | ICD-10-CM | POA: Insufficient documentation

## 2018-01-26 DIAGNOSIS — Z79899 Other long term (current) drug therapy: Secondary | ICD-10-CM | POA: Insufficient documentation

## 2018-01-26 NOTE — ED Triage Notes (Signed)
Pt presents to ED to have stitches in right chin removed (3).  Looks well healed

## 2018-01-26 NOTE — ED Provider Notes (Signed)
[facility] EMERGENCY DEPARTMENT Provider Note   [name]: [id number] Arrival date & time: 01/26/18  1334     History   Chief Complaint Chief Complaint  Patient presents with  . Suture / Staple Removal    HPI Tricia Ray is a 27 y.o. female.  27 y.o female with no PMH presents to the ED for suture removal.Patient states they were placed about 1 week ago, after she had a fall. She has been applying neosporin to the area. Denies any fever, redness or other complaints.      Past Medical History:  Diagnosis Date  . Asthma   . Seasonal allergies     There are no active problems to display for this patient.   Past Surgical History:  Procedure Laterality Date  . HAND SURGERY       OB History   None      Home Medications    Prior to Admission medications   Medication Sig Start Date End Date Taking? Authorizing Provider  albuterol (PROVENTIL HFA;VENTOLIN HFA) 108 (90 BASE) MCG/ACT inhaler Inhale 1-2 puffs into the lungs every 6 (six) hours as needed for wheezing or shortness of breath. 10/02/14   Teressa Lower, NP  amoxicillin-clavulanate (AUGMENTIN) 875-125 MG per tablet Take 1 tablet by mouth 2 (two) times daily. One po bid x 7 days 05/28/13   Muthersbaugh, Dahlia Client, PA-C  cetirizine (ZYRTEC) 10 MG tablet Take 10 mg by mouth daily as needed for allergies.    [provider]  chlorhexidine (PERIDEX) 0.12 % solution Use as directed 15 mLs in the mouth or throat 2 (two) times daily. 01/18/18   Maxwell Caul, PA-C  ibuprofen (ADVIL,MOTRIN) 200 MG tablet Take 400 mg by mouth daily as needed for pain.    [provider]  metroNIDAZOLE (FLAGYL) 500 MG tablet Take 1 tablet (500 mg total) by mouth 2 (two) times daily. 01/21/13   Eber Hong, MD  sulfamethoxazole-trimethoprim (SEPTRA DS) 800-160 MG per tablet Take 1 tablet by mouth every 12 (twelve) hours. 01/21/13   Eber Hong, MD  traMADol (ULTRAM) 50 MG tablet Take 1 tablet (50 mg total)  by mouth every 6 (six) hours as needed for pain. 01/21/13   Eber Hong, MD  trimethoprim-polymyxin b (POLYTRIM) ophthalmic solution Place 1 drop into both eyes every 4 (four) hours. X 7-10 days. 11/25/15   Ward, Chase Picket, PA-C    Family History History reviewed. No pertinent family history.  Social History Social History   Tobacco Use  . Smoking status: Never Smoker  . Smokeless tobacco: Never Used  Substance Use Topics  . Alcohol use: Yes  . Drug use: Yes    Frequency: 2.0 times per week    Types: Marijuana     Allergies   Shrimp [shellfish allergy]; Aleve [naproxen sodium]; Cherry; and Penicillins   Review of Systems Review of Systems  Constitutional: Negative for fever.  Cardiovascular: Negative for chest pain.  Skin: Positive for wound. Negative for rash.  All other systems reviewed and are negative.    Physical Exam Updated Vital Signs BP (!) 138/95 (BP Location: Right Arm)   Pulse (!) 111   Temp (!) 97.4 F (36.3 C) (Oral)   Resp 18   LMP 01/09/2018   SpO2 99%   Physical Exam  Constitutional: She is oriented to person, place, and time. She appears well-developed and well-nourished.  HENT:  Head: Normocephalic and atraumatic.  Neck: Normal range of motion. Neck supple.  Cardiovascular: Normal heart sounds.  Pulmonary/Chest: Breath sounds normal.  Abdominal: Bowel sounds are normal.  Neurological: She is alert and oriented to person, place, and time.  Skin: Skin is warm and dry.  Nursing note and vitals reviewed.    ED Treatments / Results  Labs (all labs ordered are listed, but only abnormal results are displayed) Labs Reviewed - No data to display  EKG None  Radiology No results found.  Procedures Procedures (including critical care time)  Medications Ordered in ED Medications - No data to display   Initial Impression / Assessment and Plan / ED Course  I have reviewed the triage vital signs and the nursing notes.  Pertinent  labs & imaging results that were available during my care of the patient were reviewed by me and considered in my medical decision making (see chart for details).     Patient presents for suture removal after 1 week. She denies any fever, erythema or other complaints. I have removed 3 sutures from patients chin she is advise to apply neosporin to the area as needed.   Final Clinical Impressions(s) / ED Diagnoses   Final diagnoses:  Visit for suture removal    ED Discharge Orders    None       Claude MangesSoto, Daysi Boggan, PA-C 01/26/18 1439    Sabas SousBero, Michael M, MD 01/26/18 1651

## 2018-01-26 NOTE — ED Notes (Signed)
Pt left without discharge instructions.

## 2018-01-26 NOTE — Discharge Instructions (Signed)
You may apply neosporin to the wound as needed. Keep wound covered from sun, or place Suncreen to the area.

## 2018-08-06 ENCOUNTER — Encounter (HOSPITAL_COMMUNITY): Payer: Self-pay | Admitting: Emergency Medicine

## 2018-08-06 ENCOUNTER — Ambulatory Visit (HOSPITAL_COMMUNITY)
Admission: EM | Admit: 2018-08-06 | Discharge: 2018-08-06 | Disposition: A | Discharge status: Home / Self Care | Payer: Self-pay | Attending: Family Medicine | Admitting: Family Medicine

## 2018-08-06 DIAGNOSIS — L509 Urticaria, unspecified: Secondary | ICD-10-CM

## 2018-08-06 MED ORDER — CETIRIZINE HCL 10 MG PO TABS: 10.0000 mg | ORAL_TABLET | Freq: Two times a day (BID) | ORAL | 0 refills | Status: DC

## 2018-08-06 MED ORDER — METHYLPREDNISOLONE 4 MG PO TBPK: ORAL_TABLET | ORAL | 0 refills | Status: DC

## 2018-08-06 NOTE — ED Provider Notes (Signed)
[facility]    [name]: [id number] Arrival date & time: 08/06/18  1023     History   Chief Complaint Chief Complaint  Patient presents with  . Rash    HPI Tricia Ray is a 28 y.o. female.   HPI  Patient states that she has multiple food allergies.  She has had hives that is been coming and going since January.  They started several days after she had an immunization update including a flu shot and tetanus.  She wonders whether it is related to the immunization.  She is had hives prior to her immunizations, but usually can identify what she has eaten.  This time she states nothing is new, but the hives keep coming for almost 2 months now. No change in voice.  No difficulty speaking or swallowing.  No shortness of breath. He has noticed that the hives, more commonly when she is cold.  She is also noticed that if she scratches or bumps her skin that she will develop a hive at the site.  I discussed with her cold urticaria and dermatographia some.  Past Medical History:  Diagnosis Date  . Asthma   . Seasonal allergies     There are no active problems to display for this patient.   Past Surgical History:  Procedure Laterality Date  . HAND SURGERY      OB History   No obstetric history on file.      Home Medications    Prior to Admission medications   Medication Sig Start Date End Date Taking? Authorizing Provider  albuterol (PROVENTIL HFA;VENTOLIN HFA) 108 (90 BASE) MCG/ACT inhaler Inhale 1-2 puffs into the lungs every 6 (six) hours as needed for wheezing or shortness of breath. 10/02/14   Teressa Lower, NP  cetirizine (ZYRTEC) 10 MG tablet Take 1 tablet (10 mg total) by mouth 2 (two) times daily. 08/06/18   Eustace Moore, MD  methylPREDNISolone (MEDROL DOSEPAK) 4 MG TBPK tablet tad 08/06/18   Eustace Moore, MD    Family History No family history on file.  Social History Social History   Tobacco Use  . Smoking status: Never Smoker  . Smokeless  tobacco: Never Used  Substance Use Topics  . Alcohol use: Yes  . Drug use: Yes    Frequency: 2.0 times per week    Types: Marijuana     Allergies   Shrimp [shellfish allergy]; Aleve [naproxen sodium]; Cherry; and Penicillins   Review of Systems Review of Systems  Constitutional: Negative for chills and fever.  HENT: Negative for ear pain, sore throat, trouble swallowing and voice change.   Eyes: Negative for pain and visual disturbance.  Respiratory: Negative for cough and shortness of breath.   Cardiovascular: Negative for chest pain and palpitations.  Gastrointestinal: Negative for abdominal pain and vomiting.  Genitourinary: Negative for dysuria and hematuria.  Musculoskeletal: Negative for arthralgias and back pain.  Skin: Positive for rash. Negative for color change.  Neurological: Negative for seizures and syncope.  All other systems reviewed and are negative.    Physical Exam Triage Vital Signs ED Triage Vitals  Enc Vitals Group     BP 08/06/18 1101 138/68     Pulse Rate 08/06/18 1101 81     Resp 08/06/18 1101 18     Temp 08/06/18 1101 98 F (36.7 C)     Temp src --      SpO2 08/06/18 1101 100 %     Weight --  Height --      Head Circumference --      Peak Flow --      Pain Score 08/06/18 1102 0     Pain Loc --      Pain Edu? --      Excl. in [place]? --    No data found.  Updated Vital Signs BP 138/68   Pulse 81   Temp 98 F (36.7 C)   Resp 18   LMP 08/06/2018   SpO2 100%      Physical Exam Constitutional:      General: She is not in acute distress.    Appearance: She is well-developed. She is obese.  HENT:     Head: Normocephalic and atraumatic.     Right Ear: Tympanic membrane and ear canal normal.     Left Ear: Tympanic membrane and ear canal normal.     Nose: Nose normal.     Mouth/Throat:     Mouth: Mucous membranes are moist.     Pharynx: No posterior oropharyngeal erythema.  Eyes:     Conjunctiva/sclera: Conjunctivae normal.      Pupils: Pupils are equal, round, and reactive to light.  Neck:     Musculoskeletal: Normal range of motion.  Cardiovascular:     Rate and Rhythm: Normal rate and regular rhythm.     Heart sounds: Normal heart sounds.  Pulmonary:     Effort: Pulmonary effort is normal. No respiratory distress.     Breath sounds: Normal breath sounds. No wheezing.  Abdominal:     General: There is no distension.     Palpations: Abdomen is soft.  Musculoskeletal: Normal range of motion.  Skin:    General: Skin is warm and dry.     Comments: Patches of urticarial wheals measuring 66mm to 2 cm across present on both arms and shoulders.  Few on right side of face.  Neurological:     Mental Status: She is alert.  Psychiatric:        Mood and Affect: Mood normal.        Behavior: Behavior normal.      UC Treatments / Results  Labs (all labs ordered are listed, but only abnormal results are displayed) Labs Reviewed - No data to display  EKG None  Radiology No results found.  Procedures Procedures (including critical care time)  Medications Ordered in UC Medications - No data to display  Initial Impression / Assessment and Plan / UC Course  I have reviewed the triage vital signs and the nursing notes.  Pertinent labs & imaging results that were available during my care of the patient were reviewed by me and considered in my medical decision making (see chart for details).     Urticaria is difficult to test in the urgent care center.  I told her the number of things that can cause her to break out in hives.  She needs to see an allergist if her hives persist.  Treatment involves early antihistamines.  Benadryl at first sign of hives, Zantac at double dose for a few days to settle them down.  Since there is been going on for over a month and going to give her a Medrol Dosepak.  She should keep lotion on her skin.  Avoid cold exposure. Final Clinical Impressions(s) / UC Diagnoses   Final  diagnoses:  Urticaria  Hives     Discharge Instructions     Take the zyrtec 2 x a day Take the  medrol pak as directed Take all of day one today See an allergy specialist if the hives persist    ED Prescriptions    Medication Sig Dispense Auth. Provider   cetirizine (ZYRTEC) 10 MG tablet Take 1 tablet (10 mg total) by mouth 2 (two) times daily. 60 tablet Eustace MooreNelson, Kameren Baade Sue, MD   methylPREDNISolone (MEDROL DOSEPAK) 4 MG TBPK tablet tad 21 tablet Eustace MooreNelson, Theodis Kinsel Sue, MD     Controlled Substance Prescriptions [place] Controlled Substance Registry consulted? Not Applicable   Eustace MooreNelson, Dru Primeau Sue, MD 08/06/18 1225

## 2018-08-06 NOTE — Discharge Instructions (Signed)
Take the zyrtec 2 x a day Take the medrol pak as directed Take all of day one today See an allergy specialist if the hives persist

## 2018-08-06 NOTE — ED Triage Notes (Signed)
Pt c/o hives all over her body comes and goes for the last few weeks.

## 2018-11-26 ENCOUNTER — Ambulatory Visit (HOSPITAL_COMMUNITY)
Admission: EM | Admit: 2018-11-26 | Discharge: 2018-11-26 | Disposition: A | Discharge status: Home / Self Care | Payer: Self-pay | Attending: Family Medicine | Admitting: Family Medicine

## 2018-11-26 ENCOUNTER — Other Ambulatory Visit: Payer: Self-pay

## 2018-11-26 ENCOUNTER — Encounter (HOSPITAL_COMMUNITY): Payer: Self-pay | Admitting: *Deleted

## 2018-11-26 ENCOUNTER — Inpatient Hospital Stay (HOSPITAL_COMMUNITY)
Admission: EM | Admit: 2018-11-26 | Discharge: 2018-11-27 | Disposition: A | Discharge status: Home / Self Care | Payer: Medicaid Other | Attending: Family Medicine | Admitting: Family Medicine

## 2018-11-26 ENCOUNTER — Encounter (HOSPITAL_COMMUNITY): Payer: Self-pay | Admitting: Family Medicine

## 2018-11-26 DIAGNOSIS — Z79899 Other long term (current) drug therapy: Secondary | ICD-10-CM | POA: Insufficient documentation

## 2018-11-26 DIAGNOSIS — Z3A08 8 weeks gestation of pregnancy: Secondary | ICD-10-CM | POA: Insufficient documentation

## 2018-11-26 DIAGNOSIS — Z880 Allergy status to penicillin: Secondary | ICD-10-CM | POA: Diagnosis not present

## 2018-11-26 DIAGNOSIS — Z886 Allergy status to analgesic agent status: Secondary | ICD-10-CM | POA: Diagnosis not present

## 2018-11-26 DIAGNOSIS — J45909 Unspecified asthma, uncomplicated: Secondary | ICD-10-CM | POA: Insufficient documentation

## 2018-11-26 DIAGNOSIS — O99611 Diseases of the digestive system complicating pregnancy, first trimester: Secondary | ICD-10-CM | POA: Diagnosis present

## 2018-11-26 DIAGNOSIS — E282 Polycystic ovarian syndrome: Secondary | ICD-10-CM | POA: Insufficient documentation

## 2018-11-26 DIAGNOSIS — K5900 Constipation, unspecified: Secondary | ICD-10-CM | POA: Diagnosis not present

## 2018-11-26 DIAGNOSIS — O99511 Diseases of the respiratory system complicating pregnancy, first trimester: Secondary | ICD-10-CM | POA: Insufficient documentation

## 2018-11-26 DIAGNOSIS — O26893 Other specified pregnancy related conditions, third trimester: Secondary | ICD-10-CM | POA: Diagnosis not present

## 2018-11-26 DIAGNOSIS — R110 Nausea: Secondary | ICD-10-CM

## 2018-11-26 DIAGNOSIS — O99281 Endocrine, nutritional and metabolic diseases complicating pregnancy, first trimester: Secondary | ICD-10-CM | POA: Insufficient documentation

## 2018-11-26 HISTORY — DX: Polycystic ovarian syndrome: E28.2

## 2018-11-26 LAB — COMPREHENSIVE METABOLIC PANEL
ALT: 18 U/L (ref 0–44)
AST: 14 U/L — ABNORMAL LOW (ref 15–41)
Albumin: 3.8 g/dL (ref 3.5–5.0)
Alkaline Phosphatase: 61 U/L (ref 38–126)
Anion gap: 11 (ref 5–15)
BUN: 6 mg/dL (ref 6–20)
CO2: 21 mmol/L — ABNORMAL LOW (ref 22–32)
Calcium: 9.3 mg/dL (ref 8.9–10.3)
Chloride: 104 mmol/L (ref 98–111)
Creatinine, Ser: 1.15 mg/dL — ABNORMAL HIGH (ref 0.44–1.00)
GFR calc Af Amer: >60 mL/min (ref >60)
GFR calc non Af Amer: >60 mL/min (ref >60)
Glucose, Bld: 104 mg/dL — ABNORMAL HIGH (ref 70–99)
Potassium: 3.8 mmol/L (ref 3.5–5.1)
Sodium: 136 mmol/L (ref 135–145)
Total Bilirubin: 0.5 mg/dL (ref 0.3–1.2)
Total Protein: 7.1 g/dL (ref 6.5–8.1)

## 2018-11-26 LAB — CBC
HCT: 40.4 % (ref 36.0–46.0)
Hemoglobin: 12.6 g/dL (ref 12.0–15.0)
MCH: 26.3 pg (ref 26.0–34.0)
MCHC: 31.2 g/dL (ref 30.0–36.0)
MCV: 84.3 fL (ref 80.0–100.0)
Platelets: 290 10*3/uL (ref 150–400)
RBC: 4.79 MIL/uL (ref 3.87–5.11)
RDW: 14.8 % (ref 11.5–15.5)
WBC: 15.7 10*3/uL — ABNORMAL HIGH (ref 4.0–10.5)
nRBC: 0 % (ref 0.0–0.2)

## 2018-11-26 LAB — I-STAT BETA HCG BLOOD, ED (MC, WL, AP ONLY): I-stat hCG, quantitative: >2000 m[IU]/mL — ABNORMAL HIGH (ref <5)

## 2018-11-26 MED ORDER — SODIUM CHLORIDE 0.9% FLUSH: 3.0000 mL | Freq: Once | INTRAVENOUS | Status: DC

## 2018-11-26 MED ORDER — POLYETHYLENE GLYCOL 3350 17 G PO PACK: 17.0000 g | PACK | Freq: Two times a day (BID) | ORAL | 4 refills | Status: DC

## 2018-11-26 NOTE — ED Triage Notes (Signed)
Pt sts feels constipated with no BM for some time of any normal amount

## 2018-11-26 NOTE — ED Triage Notes (Signed)
Pt reporting constipation for about 1.5 weeks. Onset of nausea and vomiting on Sunday. Unable to eat d/t n/v

## 2018-11-26 NOTE — ED Provider Notes (Signed)
Tricia Ray    [name]: [id number] Arrival date & time: 11/26/18  1953     History   Chief Complaint Chief Complaint  Patient presents with  . Constipation    HPI Tricia Ray is a 28 y.o. female.   This is an established [facility] 28 year old woman who presents with constipation.  For the last 10 days patient has been constipated with some vomiting after eating.  Only small pellets come when she tries to go to the bathroom.  Patient has long h/o PCOS and would like help losing weight and developing a plan for this.     Past Medical History:  Diagnosis Date  . Asthma   . Seasonal allergies     There are no active problems to display for this patient.   Past Surgical History:  Procedure Laterality Date  . HAND SURGERY      OB History   No obstetric history on file.      Home Medications    Prior to Admission medications   Medication Sig Start Date End Date Taking? Authorizing Provider  albuterol (PROVENTIL HFA;VENTOLIN HFA) 108 (90 BASE) MCG/ACT inhaler Inhale 1-2 puffs into the lungs every 6 (six) hours as needed for wheezing or shortness of breath. 10/02/14   Glendell Docker, NP  cetirizine (ZYRTEC) 10 MG tablet Take 1 tablet (10 mg total) by mouth 2 (two) times daily. 08/06/18   Raylene Everts, MD  polyethylene glycol (MIRALAX) 17 g packet Take 17 g by mouth 2 (two) times daily. 11/26/18   Robyn Haber, MD    Family History History reviewed. No pertinent family history.  Social History Social History   Tobacco Use  . Smoking status: Never Smoker  . Smokeless tobacco: Never Used  Substance Use Topics  . Alcohol use: Yes  . Drug use: Yes    Frequency: 2.0 times per week    Types: Marijuana     Allergies   Shrimp [shellfish allergy], Aleve [naproxen sodium], Cherry, and Penicillins   Review of Systems Review of Systems  Constitutional: Negative.   Gastrointestinal: Positive for constipation and vomiting. Negative for abdominal  pain.  Endocrine: Negative.   All other systems reviewed and are negative.    Physical Exam Triage Vital Signs ED Triage Vitals  Enc Vitals Group     BP      Pulse      Resp      Temp      Temp src      SpO2      Weight      Height      Head Circumference      Peak Flow      Pain Score      Pain Loc      Pain Edu?      Excl. in [place]?    No data found.  Updated Vital Signs BP (!) 102/57 (BP Location: Right Arm)   Pulse 84   Temp 98.3 F (36.8 C) (Oral)   Resp 18   SpO2 98%    Physical Exam Vitals signs and nursing note reviewed.  Constitutional:      General: She is not in acute distress.    Appearance: Normal appearance. She is obese. She is not ill-appearing, toxic-appearing or diaphoretic.  HENT:     Head: Normocephalic.  Eyes:     Conjunctiva/sclera: Conjunctivae normal.  Neck:     Musculoskeletal: Normal range of motion and neck supple.  Pulmonary:  Effort: Pulmonary effort is normal.  Abdominal:     General: Bowel sounds are normal. There is distension.  Musculoskeletal: Normal range of motion.  Skin:    General: Skin is warm and dry.  Neurological:     General: No focal deficit present.     Mental Status: She is alert and oriented to person, place, and time.  Psychiatric:        Mood and Affect: Mood normal.        Behavior: Behavior normal.        Thought Content: Thought content normal.        Judgment: Judgment normal.      UC Treatments / Results  Labs (all labs ordered are listed, but only abnormal results are displayed) Labs Reviewed - No data to display  EKG None  Radiology No results found.  Procedures Procedures (including critical care time)  Medications Ordered in UC Medications - No data to display  Initial Impression / Assessment and Plan / UC Course  I have reviewed the triage vital signs and the nursing notes.  Pertinent labs & imaging results that were available during my care of the patient were reviewed by  me and considered in my medical decision making (see chart for details).    Final Clinical Impressions(s) / UC Diagnoses   Final diagnoses:  Constipation, unspecified constipation type  PCOS (polycystic ovarian syndrome)   Discharge Instructions   None    ED Prescriptions    Medication Sig Dispense Auth. Provider   polyethylene glycol (MIRALAX) 17 g packet Take 17 g by mouth 2 (two) times daily. 14 each Elvina SidleLauenstein, Cyd Hostler, MD     Controlled Substance Prescriptions [place] Controlled Substance Registry consulted? Not Applicable   Elvina SidleLauenstein, Lareen Mullings, MD 11/26/18 2025

## 2018-11-27 ENCOUNTER — Other Ambulatory Visit: Payer: Self-pay

## 2018-11-27 ENCOUNTER — Encounter (HOSPITAL_COMMUNITY): Payer: Self-pay | Admitting: *Deleted

## 2018-11-27 DIAGNOSIS — K5900 Constipation, unspecified: Secondary | ICD-10-CM

## 2018-11-27 DIAGNOSIS — Z3A08 8 weeks gestation of pregnancy: Secondary | ICD-10-CM

## 2018-11-27 DIAGNOSIS — R110 Nausea: Secondary | ICD-10-CM

## 2018-11-27 DIAGNOSIS — O26893 Other specified pregnancy related conditions, third trimester: Secondary | ICD-10-CM

## 2018-11-27 MED ORDER — POLYETHYLENE GLYCOL 3350 17 G PO PACK
17.0000 g | PACK | Freq: Once | ORAL | Status: AC
  Administered 2018-11-27: 17 g via ORAL
  Filled 2018-11-27: qty 1

## 2018-11-27 MED ORDER — PROMETHAZINE HCL 25 MG PO TABS: 25.0000 mg | ORAL_TABLET | Freq: Four times a day (QID) | ORAL | 0 refills | Status: DC | PRN

## 2018-11-27 MED ORDER — PROMETHAZINE HCL 25 MG PO TABS
25.0000 mg | ORAL_TABLET | Freq: Once | ORAL | Status: AC
  Administered 2018-11-27: 25 mg via ORAL
  Filled 2018-11-27: qty 1

## 2018-11-27 NOTE — Discharge Instructions (Signed)
Constipation, Adult Constipation is when a person has fewer bowel movements in a week than normal, has difficulty having a bowel movement, or has stools that are dry, hard, or larger than normal. Constipation may be caused by an underlying condition. It may become worse with age if a person takes certain medicines and does not take in enough fluids. Follow these instructions at home: Eating and drinking   Eat foods that have a lot of fiber, such as fresh fruits and vegetables, whole grains, and beans.  Limit foods that are high in fat, low in fiber, or overly processed, such as french fries, hamburgers, cookies, candies, and soda.  Drink enough fluid to keep your urine clear or pale yellow. General instructions  Exercise regularly or as told by your health care provider.  Go to the restroom when you have the urge to go. Do not hold it in.  Take over-the-counter and prescription medicines only as told by your health care provider. These include any fiber supplements.  Practice pelvic floor retraining exercises, such as deep breathing while relaxing the lower abdomen and pelvic floor relaxation during bowel movements.  Watch your condition for any changes.  Keep all follow-up visits as told by your health care provider. This is important. Contact a health care provider if:  You have pain that gets worse.  You have a fever.  You do not have a bowel movement after 4 days.  You vomit.  You are not hungry.  You lose weight.  You are bleeding from the anus.  You have thin, pencil-like stools. Get help right away if:  You have a fever and your symptoms suddenly get worse.  You leak stool or have blood in your stool.  Your abdomen is bloated.  You have severe pain in your abdomen.  You feel dizzy or you faint. This information is not intended to replace advice given to you by your health care provider. Make sure you discuss any questions you have with your health care  provider. Document Released: 02/18/2004 Document Revised: 12/10/2015 Document Reviewed: 11/10/2015 Elsevier Interactive Patient Education  2019 Elsevier Inc.  Morning Sickness  Morning sickness is when a woman feels nauseous during pregnancy. This nauseous feeling may or may not come with vomiting. It often occurs in the morning, but it can be a problem at any time of day. Morning sickness is most common during the first trimester. In some cases, it may continue throughout pregnancy. Although morning sickness is unpleasant, it is usually harmless unless the woman develops severe and continual vomiting (hyperemesis gravidarum), a condition that requires more intense treatment. What are the causes? The exact cause of this condition is not known, but it seems to be related to normal hormonal changes that occur in pregnancy. What increases the risk? You are more likely to develop this condition if:  You experienced nausea or vomiting before your pregnancy.  You had morning sickness during a previous pregnancy.  You are pregnant with more than one baby, such as twins. What are the signs or symptoms? Symptoms of this condition include:  Nausea.  Vomiting. How is this diagnosed? This condition is usually diagnosed based on your signs and symptoms. How is this treated? In many cases, treatment is not needed for this condition. Making some changes to what you eat may help to control symptoms. Your health care provider may also prescribe or recommend:  Vitamin B6 supplements.  Anti-nausea medicines.  Ginger. Follow these instructions at home: Medicines  Take over-the-counter  and prescription medicines only as told by your health care provider. Do not use any prescription, over-the-counter, or herbal medicines for morning sickness without first talking with your health care provider.  Taking multivitamins before getting pregnant can prevent or decrease the severity of morning sickness in  most women. Eating and drinking  Eat a piece of dry toast or crackers before getting out of bed in the morning.  Eat 5 or 6 small meals a day.  Eat dry and bland foods, such as rice or a baked potato. Foods that are high in carbohydrates are often helpful.  Avoid greasy, fatty, and spicy foods.  Have someone cook for you if the smell of any food causes nausea and vomiting.  If you feel nauseous after taking prenatal vitamins, take the vitamins at night or with a snack.  Snack on protein foods between meals if you are hungry. Nuts, yogurt, and cheese are good options.  Drink fluids throughout the day.  Try ginger ale made with real ginger, ginger tea made from fresh grated ginger, or ginger candies. General instructions  Do not use any products that contain nicotine or tobacco, such as cigarettes and e-cigarettes. If you need help quitting, ask your health care provider.  Get an air purifier to keep the air in your house free of odors.  Get plenty of fresh air.  Try to avoid odors that trigger your nausea.  Consider trying these methods to help relieve symptoms: ? Wearing an acupressure wristband. These wristbands are often worn for seasickness. ? Acupuncture. Contact a health care provider if:  Your home remedies are not working and you need medicine.  You feel dizzy or light-headed.  You are losing weight. Get help right away if:  You have persistent and uncontrolled nausea and vomiting.  You faint.  You have severe pain in your abdomen. Summary  Morning sickness is when a woman feels nauseous during pregnancy. This nauseous feeling may or may not come with vomiting.  Morning sickness is most common during the first trimester.  It often occurs in the morning, but it can be a problem at any time of day.  In many cases, treatment is not needed for this condition. Making some changes to what you eat may help to control symptoms. This information is not intended  to replace advice given to you by your health care provider. Make sure you discuss any questions you have with your health care provider. Document Released: 07/13/2006 Document Revised: 06/24/2016 Document Reviewed: 06/24/2016 Elsevier Interactive Patient Education  2019 Elsevier Inc.  DICLEGIS for NAUSEA: Nausea medication to take during pregnancy:   Unisom (doxylamine succinate 25 mg tablets) Take one tablet daily at bedtime. If symptoms are not adequately controlled, the dose can be increased to a maximum recommended dose of two tablets daily (1/2 tablet in the morning, 1/2 tablet mid-afternoon and one at bedtime).  Vitamin B6 100mg  tablets. Take one tablet twice a day (up to 200 mg per day).   [facility]    [facility]       Phone: [phone]  [facility]   Phone: [phone]  [facility] [place]  Phone: [phone]  [facility] [place]  Phone: [phone]  [facility] [place]  Phone: [phone]  [facility]   Phone: [phone]  [facility]  Phone: [phone]  [facility]    Phone: [phone]   [facility]    Phone: [phone]  [facility]    Phone: [phone]  [facility]    Phone: [phone]  [facility]       Phone: [phone]   [facility]  Phone: [phone]  [facility]    Phone: [phone]  [facility]   Phone: [phone]  [facility]      Phone: [phone]  [facility]    Phone: [phone]

## 2018-11-27 NOTE — MAU Note (Signed)
Pt came to [facility] ED thinking she was constipated. Having some n/v. Found out she was pregnant at [facility] ED. Having n/v. Last BM was yesterday.

## 2018-11-27 NOTE — MAU Provider Note (Addendum)
Chief Complaint: Constipation   First Provider Initiated Contact with Patient 11/27/18 [phone]        SUBJECTIVE HPI: Tricia Ray is a 28 y.o. G1P0 at 3937w6d by LMP who presents to maternity admissions reporting constipation and nausea.  Has been 2 weeks since she had a good BM.  Had small pellets yesterday. Has been trying prune juice etc without success.  Has had nausea over the past week. . She denies vaginal bleeding, vaginal itching/burning, urinary symptoms, h/a, dizziness, n/v, or fever/chills.    Has PCOS so was not concerned when she missed a cycle.   Was seen in Urgent Care and prescribed Miralax. Pharmacy closed, so she went to ED who sent her here "to get established with an OB".      Past Medical History:  Diagnosis Date  . Asthma   . PCOS (polycystic ovarian syndrome)   . Seasonal allergies    Past Surgical History:  Procedure Laterality Date  . HAND SURGERY     Social History   Socioeconomic History  . Marital status: Single    Spouse name: Not on file  . Number of children: Not on file  . Years of education: Not on file  . Highest education level: Not on file  Occupational History  . Not on file  Social Needs  . Financial resource strain: Not on file  . Food insecurity    Worry: Not on file    Inability: Not on file  . Transportation needs    Medical: Not on file    Non-medical: Not on file  Tobacco Use  . Smoking status: Never Smoker  . Smokeless tobacco: Never Used  Substance and Sexual Activity  . Alcohol use: Yes  . Drug use: Yes    Frequency: 2.0 times per week    Types: Marijuana  . Sexual activity: Yes    Birth control/protection: Condom  Lifestyle  . Physical activity    Days per week: Not on file    Minutes per session: Not on file  . Stress: Not on file  Relationships  . Social Musicianconnections    Talks on phone: Not on file    Gets together: Not on file    Attends religious service: Not on file    Active member of club or organization:  Not on file    Attends meetings of clubs or organizations: Not on file    Relationship status: Not on file  . Intimate partner violence    Fear of current or ex partner: Not on file    Emotionally abused: Not on file    Physically abused: Not on file    Forced sexual activity: Not on file  Other Topics Concern  . Not on file  Social History Narrative  . Not on file   No current facility-administered medications on file prior to encounter.    Current Outpatient Medications on File Prior to Encounter  Medication Sig Dispense Refill  . albuterol (PROVENTIL HFA;VENTOLIN HFA) 108 (90 BASE) MCG/ACT inhaler Inhale 1-2 puffs into the lungs every 6 (six) hours as needed for wheezing or shortness of breath. 1 Inhaler 0  . cetirizine (ZYRTEC) 10 MG tablet Take 1 tablet (10 mg total) by mouth 2 (two) times daily. 60 tablet 0  . polyethylene glycol (MIRALAX) 17 g packet Take 17 g by mouth 2 (two) times daily. 14 each 4   Allergies  Allergen Reactions  . Shrimp [Shellfish Allergy] Anaphylaxis    Itching and swelling in  mounth  . Aleve [Naproxen Sodium] Hives and Swelling  . Cherry Hives, Itching and Swelling  . Penicillins     I have reviewed patient's Past Medical Hx, Surgical Hx, Family Hx, Social Hx, medications and allergies.   ROS:  Review of Systems  Constitutional: Negative for chills and fever.  Respiratory: Negative for shortness of breath.   Gastrointestinal: Positive for constipation and nausea. Negative for abdominal pain and diarrhea.  Genitourinary: Negative for dysuria, pelvic pain and vaginal bleeding.  Musculoskeletal: Negative for back pain.   Review of Systems  Other systems negative   Physical Exam  Physical Exam Patient Vitals for the past 24 hrs:  BP Temp Temp src Pulse Resp SpO2 Height Weight  11/27/18 0302 - 97.6 F (36.4 C) - 83 18 - 5\' 7"  (1.702 m) (!) 152 kg  11/26/18 2039 (!) 126/99 98.3 F (36.8 C) Oral 93 18 98 % - -   Constitutional:  Well-developed, well-nourished female in no acute distress.  Cardiovascular: normal rate Respiratory: normal effort GI: Abd soft, non-tender. Pos BS x 4 MS: Extremities nontender, no edema, normal ROM Neurologic: Alert and oriented x 4.  GU: Neg CVAT.  PELVIC EXAM: deferred I tried a bedside informal US but could only see a gestational sac  Body habitus greatly limited the US  LAB RESULTS Results for orders placed or performed during the hospital encounter of 11/26/18 (from the past 24 hour(s))  Comprehensive metabolic panel     Status: Abnormal   Collection Time: 11/26/18  8:53 PM  Result Value Ref Range   Sodium 136 135 - 145 mmol/L   Potassium 3.8 3.5 - 5.1 mmol/L   Chloride 104 98 - 111 mmol/L   CO2 21 (L) 22 - 32 mmol/L   Glucose, Bld 104 (H) 70 - 99 mg/dL   BUN 6 6 - 20 mg/dL   Creatinine, Ser 6.961.15 (H) 0.44 - 1.00 mg/dL   Calcium 9.3 8.9 - 29.510.3 mg/dL   Total Protein 7.1 6.5 - 8.1 g/dL   Albumin 3.8 3.5 - 5.0 g/dL   AST 14 (L) 15 - 41 U/L   ALT 18 0 - 44 U/L   Alkaline Phosphatase 61 38 - 126 U/L   Total Bilirubin 0.5 0.3 - 1.2 mg/dL   GFR calc non Af Amer >60 >60 mL/min   GFR calc Af Amer >60 >60 mL/min   Anion gap 11 5 - 15  CBC     Status: Abnormal   Collection Time: 11/26/18  8:53 PM  Result Value Ref Range   WBC 15.7 (H) 4.0 - 10.5 K/uL   RBC 4.79 3.87 - 5.11 MIL/uL   Hemoglobin 12.6 12.0 - 15.0 g/dL   HCT 28.440.4 13.236.0 - 44.046.0 %   MCV 84.3 80.0 - 100.0 fL   MCH 26.3 26.0 - 34.0 pg   MCHC 31.2 30.0 - 36.0 g/dL   RDW 10.214.8 72.511.5 - 36.615.5 %   Platelets 290 150 - 400 K/uL   nRBC 0.0 0.0 - 0.2 %  I-Stat beta hCG blood, ED     Status: Abnormal   Collection Time: 11/26/18  9:10 PM  Result Value Ref Range   I-stat hCG, quantitative >2,000.0 (H) <5 mIU/mL   Comment 3               IMAGING No results found.  MAU Management/MDM: DIscussed options Will give a dose of Phenergan for her nausea Will give a dose of Miralax for constipation Will give numbers of  OB/GYN  offices  ASSESSMENT Pregnancy at [redacted]w[redacted]d by LMP Constipation Nausea  PLAN Discharge home Rx Phenergan for nausea Rx Miralax for constipation Followup with prenatal care  Pt stable at time of discharge. Encouraged to return here or to other Urgent Care/ED if she develops worsening of symptoms, increase in pain, fever, or other concerning symptoms.    Hansel Feinstein CNM, MSN Certified Nurse-Midwife 11/27/2018  3:28 AM

## 2018-12-22 ENCOUNTER — Inpatient Hospital Stay (HOSPITAL_COMMUNITY)
Admission: AD | Admit: 2018-12-22 | Discharge: 2018-12-22 | Disposition: A | Discharge status: Home / Self Care | Payer: Medicaid Other | Attending: Family Medicine | Admitting: Family Medicine

## 2018-12-22 ENCOUNTER — Encounter (HOSPITAL_COMMUNITY): Payer: Self-pay | Admitting: *Deleted

## 2018-12-22 ENCOUNTER — Other Ambulatory Visit: Payer: Self-pay

## 2018-12-22 DIAGNOSIS — Z91013 Allergy to seafood: Secondary | ICD-10-CM | POA: Insufficient documentation

## 2018-12-22 DIAGNOSIS — J45909 Unspecified asthma, uncomplicated: Secondary | ICD-10-CM | POA: Diagnosis not present

## 2018-12-22 DIAGNOSIS — Z880 Allergy status to penicillin: Secondary | ICD-10-CM | POA: Diagnosis not present

## 2018-12-22 DIAGNOSIS — R112 Nausea with vomiting, unspecified: Secondary | ICD-10-CM | POA: Diagnosis present

## 2018-12-22 DIAGNOSIS — Z79899 Other long term (current) drug therapy: Secondary | ICD-10-CM | POA: Diagnosis not present

## 2018-12-22 DIAGNOSIS — Z886 Allergy status to analgesic agent status: Secondary | ICD-10-CM | POA: Diagnosis not present

## 2018-12-22 DIAGNOSIS — O99511 Diseases of the respiratory system complicating pregnancy, first trimester: Secondary | ICD-10-CM | POA: Diagnosis not present

## 2018-12-22 DIAGNOSIS — Z3A12 12 weeks gestation of pregnancy: Secondary | ICD-10-CM | POA: Diagnosis not present

## 2018-12-22 DIAGNOSIS — O219 Vomiting of pregnancy, unspecified: Secondary | ICD-10-CM | POA: Diagnosis not present

## 2018-12-22 LAB — URINALYSIS, ROUTINE W REFLEX MICROSCOPIC
Bilirubin Urine: NEGATIVE
Glucose, UA: NEGATIVE mg/dL
Hgb urine dipstick: NEGATIVE
Ketones, ur: 5 mg/dL — AB
Nitrite: NEGATIVE
Protein, ur: 100 mg/dL — AB
Specific Gravity, Urine: 1.033 — ABNORMAL HIGH (ref 1.005–1.030)
pH: 5 (ref 5.0–8.0)

## 2018-12-22 MED ORDER — PROMETHAZINE HCL 25 MG PO TABS: 25.0000 mg | ORAL_TABLET | Freq: Four times a day (QID) | ORAL | 0 refills | Status: DC | PRN

## 2018-12-22 MED ORDER — DOXYLAMINE-PYRIDOXINE 10-10 MG PO TBEC: DELAYED_RELEASE_TABLET | ORAL | 0 refills | Status: DC

## 2018-12-22 MED ORDER — LACTATED RINGERS IV BOLUS
1000.0000 mL | Freq: Once | INTRAVENOUS | Status: AC
  Administered 2018-12-22: 18:00:00 1000 mL via INTRAVENOUS

## 2018-12-22 MED ORDER — FAMOTIDINE IN NACL 20-0.9 MG/50ML-% IV SOLN
20.0000 mg | Freq: Once | INTRAVENOUS | Status: AC
  Administered 2018-12-22: 20 mg via INTRAVENOUS
  Filled 2018-12-22: qty 50

## 2018-12-22 MED ORDER — PROMETHAZINE HCL 25 MG/ML IJ SOLN
25.0000 mg | Freq: Once | INTRAMUSCULAR | Status: AC
  Administered 2018-12-22: 25 mg via INTRAVENOUS
  Filled 2018-12-22: qty 1

## 2018-12-22 NOTE — Discharge Instructions (Signed)
Morning Sickness ° °Morning sickness is when a woman feels nauseous during pregnancy. This nauseous feeling may or may not come with vomiting. It often occurs in the morning, but it can be a problem at any time of day. Morning sickness is most common during the first trimester. In some cases, it may continue throughout pregnancy. Although morning sickness is unpleasant, it is usually harmless unless the woman develops severe and continual vomiting (hyperemesis gravidarum), a condition that requires more intense treatment. °What are the causes? °The exact cause of this condition is not known, but it seems to be related to normal hormonal changes that occur in pregnancy. °What increases the risk? °You are more likely to develop this condition if: °· You experienced nausea or vomiting before your pregnancy. °· You had morning sickness during a previous pregnancy. °· You are pregnant with more than one baby, such as twins. °What are the signs or symptoms? °Symptoms of this condition include: °· Nausea. °· Vomiting. °How is this diagnosed? °This condition is usually diagnosed based on your signs and symptoms. °How is this treated? °In many cases, treatment is not needed for this condition. Making some changes to what you eat may help to control symptoms. Your health care provider may also prescribe or recommend: °· Vitamin B6 supplements. °· Anti-nausea medicines. °· Ginger. °Follow these instructions at home: °Medicines °· Take over-the-counter and prescription medicines only as told by your health care provider. Do not use any prescription, over-the-counter, or herbal medicines for morning sickness without first talking with your health care provider. °· Taking multivitamins before getting pregnant can prevent or decrease the severity of morning sickness in most women. °Eating and drinking °· Eat a piece of dry toast or crackers before getting out of bed in the morning. °· Eat 5 or 6 small meals a day. °· Eat dry and  bland foods, such as rice or a baked potato. Foods that are high in carbohydrates are often helpful. °· Avoid greasy, fatty, and spicy foods. °· Have someone cook for you if the smell of any food causes nausea and vomiting. °· If you feel nauseous after taking prenatal vitamins, take the vitamins at night or with a snack. °· Snack on protein foods between meals if you are hungry. Nuts, yogurt, and cheese are good options. °· Drink fluids throughout the day. °· Try ginger ale made with real ginger, ginger tea made from fresh grated ginger, or ginger candies. °General instructions °· Do not use any products that contain nicotine or tobacco, such as cigarettes and e-cigarettes. If you need help quitting, ask your health care provider. °· Get an air purifier to keep the air in your house free of odors. °· Get plenty of fresh air. °· Try to avoid odors that trigger your nausea. °· Consider trying these methods to help relieve symptoms: °? Wearing an acupressure wristband. These wristbands are often worn for seasickness. °? Acupuncture. °Contact a health care provider if: °· Your home remedies are not working and you need medicine. °· You feel dizzy or light-headed. °· You are losing weight. °Get help right away if: °· You have persistent and uncontrolled nausea and vomiting. °· You faint. °· You have severe pain in your abdomen. °Summary °· Morning sickness is when a woman feels nauseous during pregnancy. This nauseous feeling may or may not come with vomiting. °· Morning sickness is most common during the first trimester. °· It often occurs in the morning, but it can be a problem at   any time of day. °· In many cases, treatment is not needed for this condition. Making some changes to what you eat may help to control symptoms. °This information is not intended to replace advice given to you by your health care provider. Make sure you discuss any questions you have with your health care provider. °Document Released:  07/13/2006 Document Revised: 05/04/2017 Document Reviewed: 06/24/2016 °Elsevier Patient Education © 2020 Elsevier Inc. ° °

## 2018-12-22 NOTE — MAU Provider Note (Signed)
Chief Complaint: Emesis and Nausea   First Provider Initiated Contact with Patient 12/22/18 1727     SUBJECTIVE HPI: Tricia Ray is a 28 y.o. G1P0 at [redacted]w[redacted]d who presents to Maternity Admissions reporting nausea & vomiting. Was seen in MAU on 6/24 with same symptoms. Was prescribed phenergan but states she didn't know about the rx so never picked it up. States she's vomited 3 times today and hasn't been able to keep down any food or liquid in the last week. Denies abdominal pain or vaginal bleeding. Reports having an ultrasound in an office (can't remember name of office) on 7/9 that confirmed her dating.   Past Medical History:  Diagnosis Date  . Asthma   . PCOS (polycystic ovarian syndrome)   . Seasonal allergies    OB History  Gravida Para Term Preterm AB Living  1            SAB TAB Ectopic Multiple Live Births               # Outcome Date GA Lbr Len/2nd Weight Sex Delivery Anes PTL Lv  1 Current            Past Surgical History:  Procedure Laterality Date  . HAND SURGERY     Social History   Socioeconomic History  . Marital status: Single    Spouse name: Not on file  . Number of children: Not on file  . Years of education: Not on file  . Highest education level: Not on file  Occupational History  . Not on file  Social Needs  . Financial resource strain: Not on file  . Food insecurity    Worry: Not on file    Inability: Not on file  . Transportation needs    Medical: Not on file    Non-medical: Not on file  Tobacco Use  . Smoking status: Never Smoker  . Smokeless tobacco: Never Used  Substance and Sexual Activity  . Alcohol use: Not Currently  . Drug use: Yes    Frequency: 2.0 times per week    Types: Marijuana    Comment: last use 2 weeks ago  . Sexual activity: Yes  Lifestyle  . Physical activity    Days per week: Not on file    Minutes per session: Not on file  . Stress: Not on file  Relationships  . Social Herbalist on phone: Not on file     Gets together: Not on file    Attends religious service: Not on file    Active member of club or organization: Not on file    Attends meetings of clubs or organizations: Not on file    Relationship status: Not on file  . Intimate partner violence    Fear of current or ex partner: Not on file    Emotionally abused: Not on file    Physically abused: Not on file    Forced sexual activity: Not on file  Other Topics Concern  . Not on file  Social History Narrative  . Not on file   History reviewed. No pertinent family history. No current facility-administered medications on file prior to encounter.    Current Outpatient Medications on File Prior to Encounter  Medication Sig Dispense Refill  . albuterol (PROVENTIL HFA;VENTOLIN HFA) 108 (90 BASE) MCG/ACT inhaler Inhale 1-2 puffs into the lungs every 6 (six) hours as needed for wheezing or shortness of breath. 1 Inhaler 0  . cetirizine (ZYRTEC) 10 MG  tablet Take 1 tablet (10 mg total) by mouth 2 (two) times daily. 60 tablet 0  . polyethylene glycol (MIRALAX) 17 g packet Take 17 g by mouth 2 (two) times daily. 14 each 4  . promethazine (PHENERGAN) 25 MG tablet Take 1 tablet (25 mg total) by mouth every 6 (six) hours as needed for nausea or vomiting. 30 tablet 0   Allergies  Allergen Reactions  . Shrimp [Shellfish Allergy] Anaphylaxis    Itching and swelling in mounth  . Aleve [Naproxen Sodium] Hives and Swelling  . Cherry Hives, Itching and Swelling  . Penicillins     I have reviewed patient's Past Medical Hx, Surgical Hx, Family Hx, Social Hx, medications and allergies.   Review of Systems  Constitutional: Negative.   Gastrointestinal: Positive for nausea and vomiting. Negative for abdominal pain.  Genitourinary: Negative.     OBJECTIVE Patient Vitals for the past 24 hrs:  BP Temp Temp src Pulse Resp SpO2 Height Weight  12/22/18 1721 125/64 - - 71 - - - -  12/22/18 1630 126/74 98.2 F (36.8 C) Oral 90 20 100 % - -   12/22/18 1625 - - - - - - 5\' 7"  (1.702 m) (!) 144 kg   Constitutional: Well-developed, well-nourished female in no acute distress.  Cardiovascular: normal rate & rhythm, no murmur Respiratory: normal rate and effort. Lung sounds clear throughout GI: Abd soft, non-tender, Pos BS x 4. No guarding or rebound tenderness MS: Extremities nontender, no edema, normal ROM Neurologic: Alert and oriented x 4.      LAB RESULTS Results for orders placed or performed during the hospital encounter of 12/22/18 (from the past 24 hour(s))  Urinalysis, Routine w reflex microscopic     Status: Abnormal   Collection Time: 12/22/18  5:06 PM  Result Value Ref Range   Color, Urine AMBER (A) YELLOW   APPearance HAZY (A) CLEAR   Specific Gravity, Urine 1.033 (H) 1.005 - 1.030   pH 5.0 5.0 - 8.0   Glucose, UA NEGATIVE NEGATIVE mg/dL   Hgb urine dipstick NEGATIVE NEGATIVE   Bilirubin Urine NEGATIVE NEGATIVE   Ketones, ur 5 (A) NEGATIVE mg/dL   Protein, ur 409100 (A) NEGATIVE mg/dL   Nitrite NEGATIVE NEGATIVE   Leukocytes,Ua SMALL (A) NEGATIVE   RBC / HPF 0-5 0 - 5 RBC/hpf   WBC, UA 11-20 0 - 5 WBC/hpf   Bacteria, UA MANY (A) NONE SEEN   Squamous Epithelial / LPF 11-20 0 - 5   Mucus PRESENT    Non Squamous Epithelial 0-5 (A) NONE SEEN    IMAGING No results found.  MAU COURSE Orders Placed This Encounter  Procedures  . Urinalysis, Routine w reflex microscopic  . Discharge patient   Meds ordered this encounter  Medications  . lactated ringers bolus 1,000 mL  . promethazine (PHENERGAN) injection 25 mg  . famotidine (PEPCID) IVPB 20 mg premix  . promethazine (PHENERGAN) 25 MG tablet    Sig: Take 1 tablet (25 mg total) by mouth every 6 (six) hours as needed for nausea or vomiting.    Dispense:  30 tablet    Refill:  0    Order Specific Question:   Supervising Provider    Answer:   Levie HeritageSTINSON, JACOB J [[id number]]  . Doxylamine-Pyridoxine 10-10 MG TBEC    Sig: Start with 2 tablets every evening. If  symptoms persist, add 1 tablet every morning. If symptoms persist, add 1 tablet at mid-day.    Dispense:  120 tablet  Refill:  0    Order Specific Question:   Supervising Provider    Answer:   Levie HeritageSTINSON, JACOB J [[id number]]    MDM RN unable to doppler FHTs  Pt informed that the ultrasound is considered a limited OB ultrasound and is not intended to be a complete ultrasound exam.  Patient also informed that the ultrasound is not being completed with the intent of assessing for fetal or placental anomalies or any pelvic abnormalities.  Explained that the purpose of today's ultrasound is to assess for  viability.  Patient acknowledges the purpose of the exam and the limitations of the study.  Live IUP with FHR 160s  IV LR bolus, phenergan, & pepcid given. Patient not vomiting & reports improvement in nausea.   ASSESSMENT 1. Nausea and vomiting during pregnancy prior to [redacted] weeks gestation   2. [redacted] weeks gestation of pregnancy     PLAN Discharge home in stable condition. Discussed reasons to return to MAU Rx diclegis & phenergan Start prenatal care  Follow-up Information    [facility] Follow up.   Specialty: Obstetrics and Gynecology Why: return for worsening symptoms Contact information: [address] [id number] [place] [id number] [phone]         Allergies as of 12/22/2018      Reactions   Shrimp [shellfish Allergy] Anaphylaxis   Itching and swelling in mounth   Aleve [naproxen Sodium] Hives, Swelling   Cherry Hives, Itching, Swelling   Penicillins       Medication List    TAKE these medications   albuterol 108 (90 Base) MCG/ACT inhaler Commonly known as: VENTOLIN HFA Inhale 1-2 puffs into the lungs every 6 (six) hours as needed for wheezing or shortness of breath.   cetirizine 10 MG tablet Commonly known as: ZYRTEC Take 1 tablet (10 mg total) by mouth 2 (two) times daily.   Doxylamine-Pyridoxine 10-10 MG Tbec Start  with 2 tablets every evening. If symptoms persist, add 1 tablet every morning. If symptoms persist, add 1 tablet at mid-day.   polyethylene glycol 17 g packet Commonly known as: MiraLax Take 17 g by mouth 2 (two) times daily.   promethazine 25 MG tablet Commonly known as: PHENERGAN Take 1 tablet (25 mg total) by mouth every 6 (six) hours as needed for nausea or vomiting.        Judeth HornLawrence, Naziah Weckerly, NP 12/22/2018  7:02 PM

## 2018-12-22 NOTE — MAU Note (Signed)
Rosio Knittle is a 28 y.o. at [redacted]w[redacted]d here in MAU reporting: been having nausea since she was seen here last, states it has gotten worse, unable to keep anything down. Emesis x 6-7 in 24 hours. Has not been taking phenergan, states she did not realize that was sent in, thought only miralax was sent to pharmacy and she bought that over the counter. No diarrhea. No vaginal bleeding, states she is having some white discharge but this is normal for her. No abdominal pain.  Onset of complaint: ongoing  Pain score: 0/10  Vitals:   12/22/18 1630  BP: 126/74  Pulse: 90  Resp: 20  Temp: 98.2 F (36.8 C)  SpO2: 100%      Lab orders placed from triage: UA

## 2018-12-26 LAB — HEMOGLOBIN EVAL RFX ELECTROPHORESIS
Cystic Fibrosis Profile: NEGATIVE
Glucose 1 Hour: 149
Hemoglobin Evaluation: NORMAL
Pap: NEGATIVE

## 2018-12-26 LAB — OB RESULTS CONSOLE GC/CHLAMYDIA
Chlamydia: NEGATIVE
Gonorrhea: NEGATIVE

## 2018-12-26 LAB — OB RESULTS CONSOLE RPR: RPR: NONREACTIVE

## 2018-12-26 LAB — OB RESULTS CONSOLE HEPATITIS B SURFACE ANTIGEN: Hepatitis B Surface Ag: NEGATIVE

## 2018-12-26 LAB — OB RESULTS CONSOLE VARICELLA ZOSTER ANTIBODY, IGG: Varicella: NON-IMMUNE/NOT IMMUNE

## 2018-12-26 LAB — OB RESULTS CONSOLE RUBELLA ANTIBODY, IGM: Rubella: IMMUNE

## 2018-12-26 LAB — OB RESULTS CONSOLE ANTIBODY SCREEN: Antibody Screen: NEGATIVE

## 2018-12-26 LAB — OB RESULTS CONSOLE ABO/RH: RH Type: POSITIVE

## 2018-12-26 LAB — OB RESULTS CONSOLE HIV ANTIBODY (ROUTINE TESTING): HIV: NONREACTIVE

## 2018-12-27 ENCOUNTER — Other Ambulatory Visit (HOSPITAL_COMMUNITY): Payer: Self-pay | Admitting: Family

## 2018-12-27 DIAGNOSIS — Z3A13 13 weeks gestation of pregnancy: Secondary | ICD-10-CM

## 2018-12-27 DIAGNOSIS — Z3682 Encounter for antenatal screening for nuchal translucency: Secondary | ICD-10-CM

## 2019-01-06 LAB — GLUCOSE, 3 HOUR GESTATIONAL
Glucose 1 Hour: 135
Glucose 2 Hour: 150
Glucose 3 Hour: 144
Glucose Tolerance, Fasting: 89 (ref 70–99)

## 2019-01-08 ENCOUNTER — Other Ambulatory Visit (HOSPITAL_COMMUNITY): Payer: Self-pay | Admitting: Family

## 2019-01-08 ENCOUNTER — Encounter (HOSPITAL_COMMUNITY): Payer: Self-pay | Admitting: *Deleted

## 2019-01-08 ENCOUNTER — Ambulatory Visit (HOSPITAL_COMMUNITY): Payer: Medicaid Other

## 2019-01-08 ENCOUNTER — Ambulatory Visit (HOSPITAL_COMMUNITY): Payer: Medicaid Other | Admitting: *Deleted

## 2019-01-08 ENCOUNTER — Encounter (HOSPITAL_COMMUNITY): Payer: Self-pay

## 2019-01-08 ENCOUNTER — Ambulatory Visit (HOSPITAL_COMMUNITY)
Admission: RE | Admit: 2019-01-08 | Discharge: 2019-01-08 | Disposition: A | Discharge status: Home / Self Care | Payer: Medicaid Other | Source: Ambulatory Visit | Attending: Obstetrics and Gynecology | Admitting: Obstetrics and Gynecology

## 2019-01-08 ENCOUNTER — Other Ambulatory Visit: Payer: Self-pay

## 2019-01-08 DIAGNOSIS — Z3682 Encounter for antenatal screening for nuchal translucency: Secondary | ICD-10-CM | POA: Diagnosis present

## 2019-01-08 DIAGNOSIS — Z3A13 13 weeks gestation of pregnancy: Secondary | ICD-10-CM | POA: Diagnosis not present

## 2019-01-08 DIAGNOSIS — J45909 Unspecified asthma, uncomplicated: Secondary | ICD-10-CM

## 2019-01-08 DIAGNOSIS — O9989 Other specified diseases and conditions complicating pregnancy, childbirth and the puerperium: Secondary | ICD-10-CM

## 2019-01-08 DIAGNOSIS — O99211 Obesity complicating pregnancy, first trimester: Secondary | ICD-10-CM

## 2019-04-21 LAB — URINE CULTURE: Urine Culture, OB: NEGATIVE

## 2019-04-22 LAB — OB RESULTS CONSOLE HGB/HCT, BLOOD
HCT: 31 (ref 29–41)
Hemoglobin: 9.9

## 2019-04-22 LAB — GLUCOSE, 3 HOUR GESTATIONAL
Glucose 1 Hour: 253
Glucose 2 Hour: 278
Glucose 3 Hour: 175
Glucose Tolerance, Fasting: 112 — AB (ref 70–99)

## 2019-04-22 LAB — OB RESULTS CONSOLE RPR: RPR: NONREACTIVE

## 2019-04-23 ENCOUNTER — Encounter: Payer: Self-pay | Admitting: *Deleted

## 2019-04-23 DIAGNOSIS — O0990 Supervision of high risk pregnancy, unspecified, unspecified trimester: Secondary | ICD-10-CM

## 2019-04-23 DIAGNOSIS — E282 Polycystic ovarian syndrome: Secondary | ICD-10-CM

## 2019-04-23 DIAGNOSIS — O24419 Gestational diabetes mellitus in pregnancy, unspecified control: Secondary | ICD-10-CM

## 2019-04-23 DIAGNOSIS — J45909 Unspecified asthma, uncomplicated: Secondary | ICD-10-CM

## 2019-04-23 DIAGNOSIS — O2340 Unspecified infection of urinary tract in pregnancy, unspecified trimester: Secondary | ICD-10-CM | POA: Insufficient documentation

## 2019-04-23 LAB — OB RESULTS CONSOLE HIV ANTIBODY (ROUTINE TESTING): HIV: NONREACTIVE

## 2019-04-29 ENCOUNTER — Encounter: Payer: Self-pay | Admitting: Family Medicine

## 2019-04-29 ENCOUNTER — Other Ambulatory Visit: Payer: Self-pay

## 2019-04-29 ENCOUNTER — Ambulatory Visit (INDEPENDENT_AMBULATORY_CARE_PROVIDER_SITE_OTHER): Payer: Medicaid Other | Admitting: Family Medicine

## 2019-04-29 VITALS — BP 127/76 | HR 95 | Wt 325.5 lb

## 2019-04-29 DIAGNOSIS — O99210 Obesity complicating pregnancy, unspecified trimester: Secondary | ICD-10-CM

## 2019-04-29 DIAGNOSIS — O0990 Supervision of high risk pregnancy, unspecified, unspecified trimester: Secondary | ICD-10-CM

## 2019-04-29 DIAGNOSIS — O24415 Gestational diabetes mellitus in pregnancy, controlled by oral hypoglycemic drugs: Secondary | ICD-10-CM

## 2019-04-29 DIAGNOSIS — O99013 Anemia complicating pregnancy, third trimester: Secondary | ICD-10-CM

## 2019-04-29 LAB — GLUCOSE, CAPILLARY: Glucose-Capillary: 96 mg/dL (ref 70–99)

## 2019-04-29 MED ORDER — BLOOD PRESSURE KIT DEVI: 1.0000 | Freq: Once | 0 refills | Status: DC

## 2019-04-29 MED ORDER — ACCU-CHEK GUIDE W/DEVICE KIT: 1.0000 | PACK | Freq: Once | 0 refills | Status: AC

## 2019-04-29 MED ORDER — ACCU-CHEK GUIDE VI STRP: ORAL_STRIP | 12 refills | Status: AC

## 2019-04-29 MED ORDER — BLOOD PRESSURE KIT DEVI: 1.0000 | Freq: Once | 0 refills | Status: AC

## 2019-04-29 MED ORDER — ACCU-CHEK FASTCLIX LANCETS MISC: 1.0000 | Freq: Four times a day (QID) | 12 refills | Status: DC

## 2019-04-29 MED ORDER — FERROUS SULFATE 325 (65 FE) MG PO TABS: 325.0000 mg | ORAL_TABLET | Freq: Every day | ORAL | 1 refills | Status: DC

## 2019-04-29 MED ORDER — FERROUS SULFATE 325 (65 FE) MG PO TABS: 325.0000 mg | ORAL_TABLET | Freq: Every day | ORAL | 1 refills | Status: AC

## 2019-04-29 NOTE — Progress Notes (Signed)
   PRENATAL VISIT NOTE  Subjective:  Tricia Ray is a 28 y.o. G1P0 at 69w3dbeing seen today for ongoing prenatal care.  She is currently monitored for the following issues for this high-risk pregnancy and has Supervision of high risk pregnancy, antepartum; GDM (gestational diabetes mellitus); UTI (urinary tract infection) during pregnancy; Asthma; and PCOS (polycystic ovarian syndrome) on their problem list.  Patient referred from HD for gestational diabetes. Patient reports no complaints.  Contractions: Irritability. Vag. Bleeding: None.  Movement: Present. Denies leaking of fluid.   The following portions of the patient's history were reviewed and updated as appropriate: allergies, current medications, past family history, past medical history, past social history, past surgical history and problem list.   Objective:   Vitals:   04/29/19 1031  BP: 127/76  Pulse: 95  Weight: (!) 325 lb 8 oz (147.6 kg)    Fetal Status: Fetal Heart Rate (bpm): 143 Fundal Height: 33 cm Movement: Present     General:  Alert, oriented and cooperative. Patient is in no acute distress.  Skin: Skin is warm and dry. No rash noted.   Cardiovascular: Normal heart rate noted  Respiratory: Normal respiratory effort, no problems with respiration noted  Abdomen: Soft, gravid, appropriate for gestational age.  Pain/Pressure: Present     Pelvic: Cervical exam deferred        Extremities: Normal range of motion.  Edema: Trace  Mental Status: Normal mood and affect. Normal behavior. Normal judgment and thought content.   Assessment and Plan:  Pregnancy: G1P0 at [id number] was seen today for initial prenatal visit.  Diagnoses and all orders for this visit:  Supervision of high risk pregnancy, antepartum -     Genetic Screening -     Referral to Nutrition and Diabetes Services -     Blood Pressure Monitoring (BLOOD PRESSURE KIT) DEVI; 1 Device by Does not apply route once for 1 dose. ICD O09.90 Pt needs large  cuff - RTC in 2 weeks  - HIV not reported as part of 28 wk labs; will f/u with HD as other labs available and likely drawn but not yet [place] Gestational diabetes mellitus (GDM) in third trimester controlled on oral hypoglycemic drug -     [place] FOLLOW [place]; Future - Due last week at 28 weeks due to GDM  Anemia of pregnancy in third trimester -     Ordered: ferrous sulfate (FERROUSUL) 325 (65 FE) MG tablet; Take 1 tablet (325 mg total) by mouth daily with breakfast.  Maternal obesity affecting pregnancy, antepartum       - Discussed diet/exercise in light of weight and GDM  Other orders -     Glucose, capillary; 96 in clinic (fasting); at goal    Preterm labor symptoms and general obstetric precautions including but not limited to vaginal bleeding, contractions, leaking of fluid and fetal movement were reviewed in detail with the patient. Please refer to After Visit Summary for other counseling recommendations.   [facility] 2 weeks (around 05/13/2019) [facility]  Future Appointments  Date Time Provider DeCotati12/01/2019 10:15 AM [facility]    ChChauncey MannMD

## 2019-04-29 NOTE — Patient Instructions (Signed)
Gestational Diabetes Mellitus, Diagnosis Gestational diabetes (gestational diabetes mellitus) is a short-term (temporary) form of diabetes that can happen during pregnancy. It goes away after you give birth. It may be caused by one or both of these problems:  Your pancreas does not make enough of a hormone called insulin.  Your body does not respond in a normal way to insulin that it makes. Insulin lets sugars (glucose) go into cells in the body. This gives you energy. If you have diabetes, sugars cannot get into cells. This causes high blood sugar (hyperglycemia). If you get gestational diabetes, you are:  More likely to get it if you get pregnant again.  More likely to develop type 2 diabetes in the future. If gestational diabetes is treated, it may not hurt you or your baby. Your doctor will set treatment goals for you. In general, you should have these blood sugar levels:  After not eating for a long time (fasting): 95 mg/dL (5.3 mmol/L).  After meals (postprandial): ? One hour after a meal: at or below 140 mg/dL (7.8 mmol/L). ? Two hours after a meal: at or below 120 mg/dL (6.7 mmol/L).  A1c (hemoglobin A1c) level: 6-6.5%. Follow these instructions at home: Questions to ask your doctor   You may want to ask these questions: ? Do I need to meet with a diabetes educator? ? What equipment will I need to care for myself at home? ? What medicines do I need? When should I take them? ? How often do I need to check my blood sugar? ? What number can I call if I have questions? ? When is my next doctor's visit? General instructions  Take over-the-counter and prescription medicines only as told by your doctor.  Stay at a healthy weight during pregnancy.  Keep all follow-up visits as told by your doctor. This is important. Contact a doctor if:  Your blood sugar is at or above 240 mg/dL (13.3 mmol/L).  Your blood sugar is at or above 200 mg/dL (11.1 mmol/L) and you have ketones in  your pee (urine).  You have been sick or have had a fever for 2 days or more and you are not getting better.  You have any of these problems for more than 6 hours: ? You cannot eat or drink. ? You feel sick to your stomach (nauseous). ? You throw up (vomit). ? You have watery poop (diarrhea). Get help right away if:  Your blood sugar is lower than 54 mg/dL (3 mmol/L).  You get confused.  You have trouble: ? Thinking clearly. ? Breathing.  Your baby moves less than normal.  You have any of these: ? Moderate or large ketone levels in your pee. ? Blood coming from your vagina. ? Unusual fluid coming from your vagina. ? Early contractions. These may feel like tightness in your belly. Summary  Gestational diabetes is a short-term form of diabetes. It can happen while you are pregnant. It goes away after you give birth.  If gestational diabetes is treated, it may not hurt you or your baby. Your doctor will set treatment goals for you.  Keep all follow-up visits as told by your doctor. This is important. This information is not intended to replace advice given to you by your health care provider. Make sure you discuss any questions you have with your health care provider. Document Released: 09/13/2015 Document Revised: 06/28/2017 Document Reviewed: 06/25/2015 Elsevier Patient Education  2020 Elsevier Inc.  

## 2019-04-29 NOTE — Addendum Note (Signed)
Addended by: Louisa Second E on: 04/29/2019 12:24 PM   Modules accepted: Orders

## 2019-04-30 ENCOUNTER — Other Ambulatory Visit: Payer: Self-pay | Admitting: Family Medicine

## 2019-04-30 ENCOUNTER — Encounter: Payer: Self-pay | Admitting: General Practice

## 2019-04-30 DIAGNOSIS — O24419 Gestational diabetes mellitus in pregnancy, unspecified control: Secondary | ICD-10-CM

## 2019-05-05 ENCOUNTER — Encounter: Payer: Self-pay | Admitting: *Deleted

## 2019-05-07 ENCOUNTER — Telehealth: Payer: Self-pay | Admitting: Family Medicine

## 2019-05-07 MED ORDER — ACCU-CHEK SOFTCLIX LANCETS MISC: 12 refills | Status: AC

## 2019-05-07 NOTE — Telephone Encounter (Signed)
Called pt to answer her questions about her medications. She reports she has the Accu Chek Guide and was told that the lancets that was ordered (Fast Clik drums) does not go with her machine. She sees Diabetes Educator on Dec. 8.   Accu Chek Softclix lancets sent in to [facility] at [employer] to go with her lancing device that she received in her kit.

## 2019-05-07 NOTE — Telephone Encounter (Signed)
Patient is requesting a call back. She has some questions about her Rx.

## 2019-05-07 NOTE — Addendum Note (Signed)
Addended by: Donn Pierini on: 05/07/2019 01:24 PM   Modules accepted: Orders

## 2019-05-09 ENCOUNTER — Inpatient Hospital Stay (HOSPITAL_COMMUNITY)
Admission: AD | Admit: 2019-05-09 | Discharge: 2019-05-09 | Disposition: A | Discharge status: Home / Self Care | Payer: Medicaid Other | Attending: Obstetrics & Gynecology | Admitting: Obstetrics & Gynecology

## 2019-05-09 ENCOUNTER — Other Ambulatory Visit: Payer: Self-pay

## 2019-05-09 ENCOUNTER — Ambulatory Visit (HOSPITAL_COMMUNITY): Payer: Medicaid Other

## 2019-05-09 ENCOUNTER — Encounter (HOSPITAL_COMMUNITY): Payer: Self-pay | Admitting: *Deleted

## 2019-05-09 ENCOUNTER — Telehealth: Payer: Self-pay | Admitting: General Practice

## 2019-05-09 DIAGNOSIS — O99013 Anemia complicating pregnancy, third trimester: Secondary | ICD-10-CM | POA: Insufficient documentation

## 2019-05-09 DIAGNOSIS — Z880 Allergy status to penicillin: Secondary | ICD-10-CM | POA: Insufficient documentation

## 2019-05-09 DIAGNOSIS — I10 Essential (primary) hypertension: Secondary | ICD-10-CM | POA: Diagnosis present

## 2019-05-09 DIAGNOSIS — O99213 Obesity complicating pregnancy, third trimester: Secondary | ICD-10-CM | POA: Insufficient documentation

## 2019-05-09 DIAGNOSIS — O99513 Diseases of the respiratory system complicating pregnancy, third trimester: Secondary | ICD-10-CM | POA: Diagnosis not present

## 2019-05-09 DIAGNOSIS — Z3A30 30 weeks gestation of pregnancy: Secondary | ICD-10-CM | POA: Diagnosis not present

## 2019-05-09 DIAGNOSIS — O163 Unspecified maternal hypertension, third trimester: Secondary | ICD-10-CM | POA: Insufficient documentation

## 2019-05-09 DIAGNOSIS — Z886 Allergy status to analgesic agent status: Secondary | ICD-10-CM | POA: Diagnosis not present

## 2019-05-09 DIAGNOSIS — O0990 Supervision of high risk pregnancy, unspecified, unspecified trimester: Secondary | ICD-10-CM

## 2019-05-09 DIAGNOSIS — E282 Polycystic ovarian syndrome: Secondary | ICD-10-CM | POA: Diagnosis not present

## 2019-05-09 DIAGNOSIS — O99210 Obesity complicating pregnancy, unspecified trimester: Secondary | ICD-10-CM | POA: Insufficient documentation

## 2019-05-09 DIAGNOSIS — Z833 Family history of diabetes mellitus: Secondary | ICD-10-CM | POA: Insufficient documentation

## 2019-05-09 DIAGNOSIS — O24419 Gestational diabetes mellitus in pregnancy, unspecified control: Secondary | ICD-10-CM | POA: Insufficient documentation

## 2019-05-09 DIAGNOSIS — J45909 Unspecified asthma, uncomplicated: Secondary | ICD-10-CM | POA: Diagnosis not present

## 2019-05-09 DIAGNOSIS — D649 Anemia, unspecified: Secondary | ICD-10-CM | POA: Insufficient documentation

## 2019-05-09 DIAGNOSIS — Z3689 Encounter for other specified antenatal screening: Secondary | ICD-10-CM

## 2019-05-09 DIAGNOSIS — O99283 Endocrine, nutritional and metabolic diseases complicating pregnancy, third trimester: Secondary | ICD-10-CM | POA: Insufficient documentation

## 2019-05-09 DIAGNOSIS — Z91013 Allergy to seafood: Secondary | ICD-10-CM | POA: Insufficient documentation

## 2019-05-09 HISTORY — DX: Anemia, unspecified: D64.9

## 2019-05-09 HISTORY — DX: Gestational diabetes mellitus in pregnancy, unspecified control: O24.419

## 2019-05-09 LAB — URINALYSIS, ROUTINE W REFLEX MICROSCOPIC
Bilirubin Urine: NEGATIVE
Glucose, UA: 150 mg/dL — AB
Hgb urine dipstick: NEGATIVE
Ketones, ur: NEGATIVE mg/dL
Nitrite: NEGATIVE
Protein, ur: NEGATIVE mg/dL
Specific Gravity, Urine: 1.011 (ref 1.005–1.030)
pH: 6 (ref 5.0–8.0)

## 2019-05-09 LAB — CBC
HCT: 32.6 % — ABNORMAL LOW (ref 36.0–46.0)
Hemoglobin: 10.8 g/dL — ABNORMAL LOW (ref 12.0–15.0)
MCH: 27.4 pg (ref 26.0–34.0)
MCHC: 33.1 g/dL (ref 30.0–36.0)
MCV: 82.7 fL (ref 80.0–100.0)
Platelets: 259 10*3/uL (ref 150–400)
RBC: 3.94 MIL/uL (ref 3.87–5.11)
RDW: 13.6 % (ref 11.5–15.5)
WBC: 9.4 10*3/uL (ref 4.0–10.5)
nRBC: 0 % (ref 0.0–0.2)

## 2019-05-09 LAB — COMPREHENSIVE METABOLIC PANEL
ALT: 12 U/L (ref 0–44)
AST: 11 U/L — ABNORMAL LOW (ref 15–41)
Albumin: 2.5 g/dL — ABNORMAL LOW (ref 3.5–5.0)
Alkaline Phosphatase: 112 U/L (ref 38–126)
Anion gap: 8 (ref 5–15)
BUN: <5 mg/dL — ABNORMAL LOW (ref 6–20)
CO2: 20 mmol/L — ABNORMAL LOW (ref 22–32)
Calcium: 9.7 mg/dL (ref 8.9–10.3)
Chloride: 106 mmol/L (ref 98–111)
Creatinine, Ser: 0.67 mg/dL (ref 0.44–1.00)
GFR calc Af Amer: >60 mL/min (ref >60)
GFR calc non Af Amer: >60 mL/min (ref >60)
Glucose, Bld: 185 mg/dL — ABNORMAL HIGH (ref 70–99)
Potassium: 3.7 mmol/L (ref 3.5–5.1)
Sodium: 134 mmol/L — ABNORMAL LOW (ref 135–145)
Total Bilirubin: 0.3 mg/dL (ref 0.3–1.2)
Total Protein: 6.1 g/dL — ABNORMAL LOW (ref 6.5–8.1)

## 2019-05-09 LAB — PROTEIN / CREATININE RATIO, URINE
Creatinine, Urine: 117.47 mg/dL
Protein Creatinine Ratio: 0.1 mg/mg{Cre} (ref 0.00–0.15)
Total Protein, Urine: 12 mg/dL

## 2019-05-09 MED ORDER — ACETAMINOPHEN 500 MG PO TABS
1000.0000 mg | ORAL_TABLET | Freq: Four times a day (QID) | ORAL | Status: DC | PRN
  Filled 2019-05-09: qty 2

## 2019-05-09 NOTE — MAU Provider Note (Signed)
History     [name]: [id number]  Arrival date and time: 05/09/19 1239   First Provider Initiated Contact with Patient 05/09/19 1417      Chief Complaint  Patient presents with  . Hypertension   28 y.o. G1 @30 .6 wks presenting with elevated BP. Reports BP at home 160s/80s. Reports frontal HA since yesterday. Describes as cloudy and tight. Has not taken anything for it. Denies visual disturbances, epigastic pain, SOB and CP. Denies VB, LOF, ctx. +FM.   OB History    Gravida  1   Para      Term      Preterm      AB      Living        SAB      TAB      Ectopic      Multiple      Live Births              Past Medical History:  Diagnosis Date  . Anemia   . Asthma   . Gestational diabetes   . Morbid obesity (HCC)   . PCOS (polycystic ovarian syndrome)   . Seasonal allergies     Past Surgical History:  Procedure Laterality Date  . HAND SURGERY      Family History  Problem Relation Age of Onset  . Cancer Mother   . Leukemia Mother   . Diabetes Mother     Social History   Tobacco Use  . Smoking status: Never Smoker  . Smokeless tobacco: Never Used  Substance Use Topics  . Alcohol use: Not Currently  . Drug use: Not Currently    Frequency: 2.0 times per week    Types: Marijuana    Comment: last use 2 weeks ago    Allergies:  Allergies  Allergen Reactions  . Shrimp [Shellfish Allergy] Anaphylaxis    Itching and swelling in mounth  . Aleve [Naproxen Sodium] Hives and Swelling  . Cherry Hives, Itching and Swelling  . Penicillins     Medications Prior to Admission  Medication Sig Dispense Refill Last Dose  . Prenatal Vit-Fe Fumarate-FA (PRENATAL VITAMIN PO) Take by mouth.   05/09/2019 at 0100  . Accu-Chek Softclix Lancets lancets Use as instructed 100 each 12   . albuterol (PROVENTIL HFA;VENTOLIN HFA) 108 (90 BASE) MCG/ACT inhaler Inhale 1-2 puffs into the lungs every 6 (six) hours as needed for wheezing or shortness of breath. (Patient not  taking: Reported on 04/29/2019) 1 Inhaler 0   . Doxylamine-Pyridoxine 10-10 MG TBEC Start with 2 tablets every evening. If symptoms persist, add 1 tablet every morning. If symptoms persist, add 1 tablet at mid-day. (Patient not taking: Reported on 01/08/2019) 120 tablet 0   . ferrous sulfate 325 (65 FE) MG tablet Take 1 tablet (325 mg total) by mouth daily with breakfast. 60 tablet 1   . glucose blood (ACCU-CHEK GUIDE) test strip Use as instructed 100 each 12   . promethazine (PHENERGAN) 25 MG tablet Take 1 tablet (25 mg total) by mouth every 6 (six) hours as needed for nausea or vomiting. (Patient not taking: Reported on 04/29/2019) 30 tablet 0     Review of Systems  Constitutional: Negative for chills and fever.  Gastrointestinal: Negative for abdominal pain.  Genitourinary: Negative for vaginal bleeding.   Physical Exam   Blood pressure 125/73, pulse 87, temperature 99.2 F (37.3 C), temperature source Oral, resp. rate 20, height 5\' 7"  (1.702 m), weight (!) 146.1 kg, last menstrual period  09/19/2018, SpO2 100 %. Patient Vitals for the past 24 hrs:  BP Temp Temp src Pulse Resp SpO2 Height Weight  05/09/19 1606 125/73 99.2 F (37.3 C) Oral 87 20 100 % - -  05/09/19 1601 (!) 116/59 - - 82 - - - -  05/09/19 1546 (!) 129/59 - - 87 - - - -  05/09/19 1530 127/83 - - 88 18 - - -  05/09/19 1515 123/69 - - 92 20 - - -  05/09/19 1500 133/76 - - 90 18 - - -  05/09/19 1446 131/81 - - (!) 102 - - - -  05/09/19 1431 130/67 - - 88 - - - -  05/09/19 1415 140/69 - - 87 - - - -  05/09/19 1401 138/70 - - 95 - - - -  05/09/19 1354 (!) 143/78 - - 97 - - - -  05/09/19 1338 (!) 149/82 - - (!) 107 - 99 % 5\' 7"  (1.702 m) (!) 146.1 kg   Physical Exam  Nursing note and vitals reviewed. Constitutional: She is oriented to person, place, and time. She appears well-developed and well-nourished. No distress.  HENT:  Head: Normocephalic and atraumatic.  Neck: Normal range of motion.  Cardiovascular: Normal  rate.  Respiratory: Effort normal. No respiratory distress.  Musculoskeletal: Normal range of motion.  Neurological: She is alert and oriented to person, place, and time.  Psychiatric: She has a normal mood and affect.  EFM: 150 bpm, mod variability, + accels, no decels Toco: irritability  Results for orders placed or performed during the hospital encounter of 05/09/19 (from the past 24 hour(s))  Urinalysis, Routine w reflex microscopic     Status: Abnormal   Collection Time: 05/09/19 12:54 PM  Result Value Ref Range   Color, Urine YELLOW YELLOW   APPearance CLEAR CLEAR   Specific Gravity, Urine 1.011 1.005 - 1.030   pH 6.0 5.0 - 8.0   Glucose, UA 150 (A) NEGATIVE mg/dL   Hgb urine dipstick NEGATIVE NEGATIVE   Bilirubin Urine NEGATIVE NEGATIVE   Ketones, ur NEGATIVE NEGATIVE mg/dL   Protein, ur NEGATIVE NEGATIVE mg/dL   Nitrite NEGATIVE NEGATIVE   Leukocytes,Ua SMALL (A) NEGATIVE   RBC / HPF 0-5 0 - 5 RBC/hpf   WBC, UA 0-5 0 - 5 WBC/hpf   Bacteria, UA FEW (A) NONE SEEN   Squamous Epithelial / LPF 0-5 0 - 5   Mucus PRESENT   Protein / creatinine ratio, urine     Status: None   Collection Time: 05/09/19 12:54 PM  Result Value Ref Range   Creatinine, Urine 117.47 mg/dL   Total Protein, Urine 12 mg/dL   Protein Creatinine Ratio 0.10 0.00 - 0.15 mg/mg[Cre]  CBC     Status: Abnormal   Collection Time: 05/09/19  2:13 PM  Result Value Ref Range   WBC 9.4 4.0 - 10.5 K/uL   RBC 3.94 3.87 - 5.11 MIL/uL   Hemoglobin 10.8 (L) 12.0 - 15.0 g/dL   HCT 32.6 (L) 36.0 - 46.0 %   MCV 82.7 80.0 - 100.0 fL   MCH 27.4 26.0 - 34.0 pg   MCHC 33.1 30.0 - 36.0 g/dL   RDW 13.6 11.5 - 15.5 %   Platelets 259 150 - 400 K/uL   nRBC 0.0 0.0 - 0.2 %  Comprehensive metabolic panel     Status: Abnormal   Collection Time: 05/09/19  2:13 PM  Result Value Ref Range   Sodium 134 (L) 135 - 145 mmol/L  Potassium 3.7 3.5 - 5.1 mmol/L   Chloride 106 98 - 111 mmol/L   CO2 20 (L) 22 - 32 mmol/L   Glucose,  Bld 185 (H) 70 - 99 mg/dL   BUN <5 (L) 6 - 20 mg/dL   Creatinine, Ser 4.090.67 0.44 - 1.00 mg/dL   Calcium 9.7 8.9 - 81.110.3 mg/dL   Total Protein 6.1 (L) 6.5 - 8.1 g/dL   Albumin 2.5 (L) 3.5 - 5.0 g/dL   AST 11 (L) 15 - 41 U/L   ALT 12 0 - 44 U/L   Alkaline Phosphatase 112 38 - 126 U/L   Total Bilirubin 0.3 0.3 - 1.2 mg/dL   GFR calc non Af Amer >60 >60 mL/min   GFR calc Af Amer >60 >60 mL/min   Anion gap 8 5 - 15   MAU Course  Procedures  MDM Chart reviewed: hx of obesity, PCOS, A1GDM, & asthma. She reports some elevated BP readings when she wasn't pregnant but upon recheck they were normal. Has not been told she has HTN, never on meds. ?CHTN vs gHTN. PEC labs normal. Glucose high today, A1GDM but may need meds. Will check BS log in office. HA resolved w/o intervention. Discussed PEC precautions. Stable for discharge home.   Assessment and Plan   1. [redacted] weeks gestation of pregnancy   2. Hypertension during pregnancy in third trimester, unspecified hypertension in pregnancy type   3. NST (non-stress test) reactive    [facility]  Allergies as of 05/09/2019      Reactions   Shrimp [shellfish Allergy] Anaphylaxis   Itching and swelling in mounth   Aleve [naproxen Sodium] Hives, Swelling   Cherry Hives, Itching, Swelling   Penicillins       Medication List    STOP taking these medications   albuterol 108 (90 Base) MCG/ACT inhaler Commonly known as: VENTOLIN HFA   Doxylamine-Pyridoxine 10-10 MG Tbec   promethazine 25 MG tablet Commonly known as: PHENERGAN     TAKE these medications   Accu-Chek Guide test strip Generic drug: glucose blood Use as instructed   Accu-Chek Softclix Lancets lancets Use as instructed   ferrous sulfate 325 (65 FE) MG tablet Take 1 tablet (325 mg total) by mouth daily with breakfast.   PRENATAL VITAMIN PO Take by mouth.       Donette LarryMelanie Suzetta Timko, CNM 05/09/2019, 4:10 PM

## 2019-05-09 NOTE — Telephone Encounter (Signed)
Received alert via Babyscripts for elevated blood pressures of 162/96 with repeat 146/93. Called patient to inquire about symptoms and she reports her head has been feeling "cloudy" lately. Denies dizziness or blurry vision. Asked patient to recheck blood pressure and she reports repeat of 166/89. Discussed with Dr Rip Harbour who states patient should go to [facility] for evaluation. Discussed with patient. She verbalized understanding & had no questions. [facility] called & notified.

## 2019-05-09 NOTE — MAU Note (Signed)
.   Tricia Ray is a 28 y.o. at [redacted]w[redacted]d here in [facility]U reporting: an increase in blood pressure, pt is checking blood pressures at home. Denies any pain  Onset of complaint: ongoing Pain score: 0 Vitals:   05/09/19 1338  BP: (!) 149/82  Pulse: (!) 107  SpO2: 99%     FHT 147 Lab orders placed from triage: UA

## 2019-05-09 NOTE — Discharge Instructions (Signed)
Hypertension During Pregnancy °Hypertension is also called high blood pressure. High blood pressure means that the force of your blood moving in your body is too strong. It can cause problems for you and your baby. Different types of high blood pressure can happen during pregnancy. The types are: °· High blood pressure before you got pregnant. This is called chronic hypertension.  This can continue during your pregnancy. Your doctor will want to keep checking your blood pressure. You may need medicine to keep your blood pressure under control while you are pregnant. You will need follow-up visits after you have your baby. °· High blood pressure that goes up during pregnancy when it was normal before. This is called gestational hypertension. It will usually get better after you have your baby, but your doctor will need to watch your blood pressure to make sure that it is getting better. °· Very high blood pressure during pregnancy. This is called preeclampsia. Very high blood pressure is an emergency that needs to be checked and treated right away. °· You may develop very high blood pressure after giving birth. This is called postpartum preeclampsia. This usually occurs within 48 hours after childbirth but may occur up to 6 weeks after giving birth. This is rare. °How does this affect me? °If you have high blood pressure during pregnancy, you have a higher chance of developing high blood pressure: °· As you get older. °· If you get pregnant again. °In some cases, high blood pressure during pregnancy can cause: °· Stroke. °· Heart attack. °· Damage to the kidneys, lungs, or liver. °· Preeclampsia. °· Jerky movements you cannot control (convulsions or seizures). °· Problems with the placenta. °How does this affect my baby? °Your baby may: °· Be born early. °· Not weigh as much as he or she should. °· Not handle labor well, leading to a c-section birth. °What are the risks? °· Having high blood pressure during a past  pregnancy. °· Being overweight. °· Being 35 years old or older. °· Being pregnant for the first time. °· Being pregnant with more than one baby. °· Becoming pregnant using fertility methods, such as IVF. °· Having other problems, such as diabetes, or kidney disease. °· Having family members who have high blood pressure. °What can I do to lower my risk? ° °· Keep a healthy weight. °· Eat a healthy diet. °· Follow what your doctor tells you about treating any medical problems that you had before becoming pregnant. °It is very important to go to all of your doctor visits. Your doctor will check your blood pressure and make sure that your pregnancy is progressing as it should. Treatment should start early if a problem is found. °How is this treated? °Treatment for high blood pressure during pregnancy can differ depending on the type of high blood pressure you have and how serious it is. °· You may need to take blood pressure medicine. °· If you have been taking medicine for your blood pressure, you may need to change the medicine during pregnancy if it is not safe for your baby. °· If your doctor thinks that you could get very high blood pressure, he or she may tell you to take a low-dose aspirin during your pregnancy. °· If you have very high blood pressure, you may need to stay in the hospital so you and your baby can be watched closely. You may also need to take medicine to lower your blood pressure. This medicine may be given by mouth   or through an IV tube. °· In some cases, if your condition gets worse, you may need to have your baby early. °Follow these instructions at home: °Eating and drinking ° °· Drink enough fluid to keep your pee (urine) pale yellow. °· Avoid caffeine. °Lifestyle °· Do not use any products that contain nicotine or tobacco, such as cigarettes, e-cigarettes, and chewing tobacco. If you need help quitting, ask your doctor. °· Do not use alcohol or drugs. °· Avoid stress. °· Rest and get plenty  of sleep. °· Regular exercise can help. Ask your doctor what kinds of exercise are best for you. °General instructions °· Take over-the-counter and prescription medicines only as told by your doctor. °· Keep all prenatal and follow-up visits as told by your doctor. This is important. °Contact a doctor if: °· You have symptoms that your doctor told you to watch for, such as: °? Headaches. °? Nausea. °? Vomiting. °? Belly (abdominal) pain. °? Dizziness. °? Light-headedness. °Get help right away if: °· You have: °? Very bad belly pain that does not get better with treatment. °? A very bad headache that does not get better. °? Vomiting that does not get better. °? Sudden, fast weight gain. °? Sudden swelling in your hands, ankles, or face. °? Bleeding from your vagina. °? Blood in your pee. °? Blurry vision. °? Double vision. °? Shortness of breath. °? Chest pain. °? Weakness on one side of your body. °? Trouble talking. °· Your baby is not moving as much as usual. °Summary °· High blood pressure is also called hypertension. °· High blood pressure means that the force of your blood moving in your body is too strong. °· High blood pressure can cause problems for you and your baby. °· Keep all follow-up visits as told by your doctor. This is important. °This information is not intended to replace advice given to you by your health care provider. Make sure you discuss any questions you have with your health care provider. °Document Released: 06/24/2010 Document Revised: 09/12/2018 Document Reviewed: 06/18/2018 °Elsevier Patient Education © 2020 [employer] ° °

## 2019-05-12 ENCOUNTER — Telehealth: Payer: Self-pay

## 2019-05-12 NOTE — Telephone Encounter (Signed)
Received notification from [facility]ys[facility] that pt reported a BP value of 140/90.  Pt has an appt on 05/13/19 in which pt can be evaluated.  Note placed into pt's appt note to make sure to f/u with pt.

## 2019-05-13 ENCOUNTER — Encounter: Payer: Medicaid Other | Attending: Family Medicine | Admitting: *Deleted

## 2019-05-13 ENCOUNTER — Other Ambulatory Visit: Payer: Self-pay

## 2019-05-13 ENCOUNTER — Encounter: Payer: Self-pay | Admitting: Family Medicine

## 2019-05-13 ENCOUNTER — Ambulatory Visit: Payer: Medicaid Other | Admitting: *Deleted

## 2019-05-13 ENCOUNTER — Ambulatory Visit (INDEPENDENT_AMBULATORY_CARE_PROVIDER_SITE_OTHER): Payer: Medicaid Other | Admitting: Family Medicine

## 2019-05-13 VITALS — BP 139/85 | HR 90 | Temp 98.0°F | Wt 329.8 lb

## 2019-05-13 DIAGNOSIS — O10919 Unspecified pre-existing hypertension complicating pregnancy, unspecified trimester: Secondary | ICD-10-CM

## 2019-05-13 DIAGNOSIS — O24410 Gestational diabetes mellitus in pregnancy, diet controlled: Secondary | ICD-10-CM | POA: Insufficient documentation

## 2019-05-13 DIAGNOSIS — O24419 Gestational diabetes mellitus in pregnancy, unspecified control: Secondary | ICD-10-CM

## 2019-05-13 DIAGNOSIS — O0990 Supervision of high risk pregnancy, unspecified, unspecified trimester: Secondary | ICD-10-CM

## 2019-05-13 DIAGNOSIS — O0993 Supervision of high risk pregnancy, unspecified, third trimester: Secondary | ICD-10-CM

## 2019-05-13 DIAGNOSIS — O10913 Unspecified pre-existing hypertension complicating pregnancy, third trimester: Secondary | ICD-10-CM

## 2019-05-13 DIAGNOSIS — O99213 Obesity complicating pregnancy, third trimester: Secondary | ICD-10-CM

## 2019-05-13 DIAGNOSIS — Z3A31 31 weeks gestation of pregnancy: Secondary | ICD-10-CM

## 2019-05-13 MED ORDER — LABETALOL HCL 200 MG PO TABS: 200.0000 mg | ORAL_TABLET | Freq: Two times a day (BID) | ORAL | 3 refills | Status: DC

## 2019-05-13 MED ORDER — ASPIRIN EC 81 MG PO TBEC: 81.0000 mg | DELAYED_RELEASE_TABLET | Freq: Every day | ORAL | 2 refills | Status: DC

## 2019-05-13 NOTE — Progress Notes (Signed)
Subjective:  Tricia Ray is a 28 y.o. G1P0 at [redacted]w[redacted]d being seen today for ongoing prenatal care.  She is currently monitored for the following issues for this high-risk pregnancy and has Supervision of high risk pregnancy, antepartum; GDM (gestational diabetes mellitus); UTI (urinary tract infection) during pregnancy; Asthma; PCOS (polycystic ovarian syndrome); and Obesity affecting pregnancy on their problem list.  GDM: Patient diet controlled. Started checking on 12/3 Hasn't been able to check much due to being busy at work. Often misses lunch.   Also having elevated BP: 160/90 at home. Rechecks it and eventually it comes down. Has slight headache and blurred vision.  Patient reports no complaints.  Contractions: Not present. Vag. Bleeding: None.  Movement: Present. Denies leaking of fluid.   The following portions of the patient's history were reviewed and updated as appropriate: allergies, current medications, past family history, past medical history, past social history, past surgical history and problem list. Problem list updated.  Objective:   Vitals:   05/13/19 0848  BP: 139/85  Pulse: 90  Temp: 98 F (36.7 C)  Weight: (!) 329 lb 12.8 oz (149.6 kg)    Fetal Status: Fetal Heart Rate (bpm): 153   Movement: Present     General:  Alert, oriented and cooperative. Patient is in no acute distress.  Skin: Skin is warm and dry. No rash noted.   Cardiovascular: Normal heart rate noted  Respiratory: Normal respiratory effort, no problems with respiration noted  Abdomen: Soft, gravid, appropriate for gestational age. Pain/Pressure: Present     Pelvic: Vag. Bleeding: None     Cervical exam deferred        Extremities: Normal range of motion.  Edema: Trace  Mental Status: Normal mood and affect. Normal behavior. Normal judgment and thought content.   Urinalysis:      Assessment and Plan:  Pregnancy: G1P0 at [redacted]w[redacted]d  1. Supervision of high risk pregnancy, antepartum FHT normal  2.  Gestational diabetes mellitus (GDM), antepartum, gestational diabetes method of control unspecified Discussed compliance and stressed importance of checking CBGs. Will give note for work. F/u in 1 week.  3. Hypertension during pregnancy in third trimester, unspecified hypertension in pregnancy type Has [place] on Thursday. She is stage 1 chronic hypertensive BP (130/90) prior to pregnancy. Start ASA 81mg . Has [place] Thursday. Start low dose labetalol  4. Obesity affecting pregnancy in third trimester    Preterm labor symptoms and general obstetric precautions including but not limited to vaginal bleeding, contractions, leaking of fluid and fetal movement were reviewed in detail with the patient. Please refer to After Visit Summary for other counseling recommendations.  No follow-ups on file.   Truett Mainland, DO

## 2019-05-13 NOTE — Progress Notes (Signed)
  Patient was seen on 05/13/19 for Gestational Diabetes self-management. EDD 07/11/2018. Patient states no history of GDM. Diet history obtained. Patient eats good variety of all food groups. Beverages include water with drop-in, some Juicy Juice.  The following learning objectives were met by the patient :   States the definition of Gestational Diabetes  States why dietary management is important in controlling blood glucose  Describes the effects of carbohydrates on blood glucose levels  Demonstrates ability to create a balanced meal plan  Demonstrates carbohydrate counting   States when to check blood glucose levels  Demonstrates proper blood glucose monitoring techniques  States the effect of stress and exercise on blood glucose levels  States the importance of limiting caffeine and abstaining from alcohol and smoking  Plan:  Aim for 3 Carb Choices per meal (45 grams) +/- 1 either way  Aim for 1-2 Carbs per snack Begin reading food labels for Total Carbohydrate of foods If OK with your MD, consider  increasing your activity level by walking, Arm Chair Exercises or other activity daily as tolerated Begin checking BG before breakfast and 2 hours after first bite of breakfast, lunch and dinner as directed by MD  Bring Log Book/Sheet and meter to every medical appointment OR use Baby Scripts (see below) Baby Scripts:  Patient current using [employer] and plans to use as record of BG electronically. Take medication if directed by MD  Patient already has a meter: Accu-check guide And is testing pre breakfast and 2 hours each meal as directed by MD Pt reports first few BG reading within target range.   Patient instructed to monitor glucose levels: FBS: 60 - 95 mg/dl 2 hour: <120 mg/dl  Patient received the following handouts:  Nutrition Diabetes and Pregnancy  Carbohydrate Counting List  Patient will be seen for follow-up in as needed.

## 2019-05-13 NOTE — Addendum Note (Signed)
Addended by: Truett Mainland on: 05/13/2019 10:17 AM   Modules accepted: Orders

## 2019-05-13 NOTE — Progress Notes (Signed)
Pt has been checking her blood sugar only about 2 times daily due to work schedule and not being able to eat. She reports her BP is sometimes >160/90.  During that time she has tightness in her head along with blurry vision. She has also noticed her eyes being swollen in the morning at times.

## 2019-05-16 ENCOUNTER — Other Ambulatory Visit: Payer: Self-pay

## 2019-05-16 ENCOUNTER — Other Ambulatory Visit (HOSPITAL_COMMUNITY): Payer: Self-pay | Admitting: *Deleted

## 2019-05-16 ENCOUNTER — Ambulatory Visit (HOSPITAL_COMMUNITY)
Admission: RE | Admit: 2019-05-16 | Discharge: 2019-05-16 | Disposition: A | Discharge status: Home / Self Care | Payer: Medicaid Other | Source: Ambulatory Visit | Attending: Obstetrics and Gynecology | Admitting: Obstetrics and Gynecology

## 2019-05-16 ENCOUNTER — Encounter (HOSPITAL_COMMUNITY): Payer: Self-pay

## 2019-05-16 ENCOUNTER — Ambulatory Visit (HOSPITAL_COMMUNITY): Payer: Medicaid Other | Admitting: *Deleted

## 2019-05-16 DIAGNOSIS — Z3A31 31 weeks gestation of pregnancy: Secondary | ICD-10-CM | POA: Diagnosis not present

## 2019-05-16 DIAGNOSIS — O24419 Gestational diabetes mellitus in pregnancy, unspecified control: Secondary | ICD-10-CM | POA: Insufficient documentation

## 2019-05-16 DIAGNOSIS — O24410 Gestational diabetes mellitus in pregnancy, diet controlled: Secondary | ICD-10-CM | POA: Diagnosis present

## 2019-05-16 DIAGNOSIS — O0990 Supervision of high risk pregnancy, unspecified, unspecified trimester: Secondary | ICD-10-CM | POA: Insufficient documentation

## 2019-05-16 DIAGNOSIS — E282 Polycystic ovarian syndrome: Secondary | ICD-10-CM | POA: Insufficient documentation

## 2019-05-16 DIAGNOSIS — O99213 Obesity complicating pregnancy, third trimester: Secondary | ICD-10-CM | POA: Insufficient documentation

## 2019-05-16 DIAGNOSIS — O10013 Pre-existing essential hypertension complicating pregnancy, third trimester: Secondary | ICD-10-CM | POA: Diagnosis not present

## 2019-05-19 ENCOUNTER — Encounter: Payer: Self-pay | Admitting: General Practice

## 2019-05-21 ENCOUNTER — Encounter: Payer: Self-pay | Admitting: Obstetrics and Gynecology

## 2019-05-21 ENCOUNTER — Other Ambulatory Visit: Payer: Self-pay

## 2019-05-21 ENCOUNTER — Ambulatory Visit (INDEPENDENT_AMBULATORY_CARE_PROVIDER_SITE_OTHER): Payer: Medicaid Other | Admitting: Obstetrics and Gynecology

## 2019-05-21 VITALS — BP 139/82 | HR 97 | Wt 337.0 lb

## 2019-05-21 DIAGNOSIS — O24419 Gestational diabetes mellitus in pregnancy, unspecified control: Secondary | ICD-10-CM

## 2019-05-21 DIAGNOSIS — O3663X0 Maternal care for excessive fetal growth, third trimester, not applicable or unspecified: Secondary | ICD-10-CM

## 2019-05-21 DIAGNOSIS — Z6841 Body Mass Index (BMI) 40.0 and over, adult: Secondary | ICD-10-CM

## 2019-05-21 DIAGNOSIS — O3660X0 Maternal care for excessive fetal growth, unspecified trimester, not applicable or unspecified: Secondary | ICD-10-CM

## 2019-05-21 DIAGNOSIS — O99213 Obesity complicating pregnancy, third trimester: Secondary | ICD-10-CM

## 2019-05-21 DIAGNOSIS — O0990 Supervision of high risk pregnancy, unspecified, unspecified trimester: Secondary | ICD-10-CM

## 2019-05-21 DIAGNOSIS — Z3A32 32 weeks gestation of pregnancy: Secondary | ICD-10-CM

## 2019-05-21 DIAGNOSIS — O10919 Unspecified pre-existing hypertension complicating pregnancy, unspecified trimester: Secondary | ICD-10-CM

## 2019-05-21 DIAGNOSIS — O0993 Supervision of high risk pregnancy, unspecified, third trimester: Secondary | ICD-10-CM

## 2019-05-21 DIAGNOSIS — O10913 Unspecified pre-existing hypertension complicating pregnancy, third trimester: Secondary | ICD-10-CM

## 2019-05-21 LAB — GLUCOSE, CAPILLARY: Glucose-Capillary: 156 mg/dL — ABNORMAL HIGH (ref 70–99)

## 2019-05-21 MED ORDER — INSULIN ASPART 100 UNIT/ML ~~LOC~~ SOLN: 29.0000 [IU] | Freq: Three times a day (TID) | SUBCUTANEOUS | 12 refills | Status: DC

## 2019-05-21 MED ORDER — INSULIN GLARGINE 100 UNIT/ML ~~LOC~~ SOLN: 35.0000 [IU] | Freq: Every day | SUBCUTANEOUS | 11 refills | Status: DC

## 2019-05-21 NOTE — Progress Notes (Signed)
Prenatal Visit Note Date: 05/21/2019 Clinic: [facility]  Subjective:  Tricia Ray is a 28 y.o. G1P0 at [redacted]w[redacted]d being seen today for ongoing prenatal care.  She is currently monitored for the following issues for this high-risk pregnancy and has Supervision of high risk pregnancy, antepartum; GDM (gestational diabetes mellitus); UTI (urinary tract infection) during pregnancy; Asthma; PCOS (polycystic ovarian syndrome); Obesity affecting pregnancy; Chronic hypertension during pregnancy; BMI 50.0-59.9, adult ([place]); and LGA (large for gestational age) fetus affecting management of mother on their problem list.  Patient reports no complaints.   Contractions: Not present. Vag. Bleeding: None.  Movement: Present. Denies leaking of fluid.   The following portions of the patient's history were reviewed and updated as appropriate: allergies, current medications, past family history, past medical history, past social history, past surgical history and problem list. Problem list updated.  Objective:   Vitals:   05/21/19 0954  BP: 139/82  Pulse: 97  Weight: (!) 337 lb (152.9 kg)    Fetal Status: Fetal Heart Rate (bpm): 154   Movement: Present     General:  Alert, oriented and cooperative. Patient is in no acute distress.  Skin: Skin is warm and dry. No rash noted.   Cardiovascular: Normal heart rate noted  Respiratory: Normal respiratory effort, no problems with respiration noted  Abdomen: Soft, gravid, appropriate for gestational age. Pain/Pressure: Absent     Pelvic:  Cervical exam deferred        Extremities: Normal range of motion.  Edema: Trace  Mental Status: Normal mood and affect. Normal behavior. Normal judgment and thought content.   Urinalysis:      Assessment and Plan:  Pregnancy: G1P0 at [redacted]w[redacted]d  1. Chronic hypertension during pregnancy Based on review of VS from past encounters, I would call her cHTN. She is doing well on no meds Already set up for ap  testing with [facility]  2. Supervision of high risk pregnancy, antepartum Routine care - VITAMIN D 25 Hydroxy (Vit-D Deficiency, Fractures) - Hemoglobin A1c  3. Obesity affecting pregnancy in third trimester Watch weight  4. BMI 50.0-59.9, adult ([place])  5. Excessive fetal growth affecting management of pregnancy, antepartum, single or unspecified fetus See below  6. Diet controlled gestational diabetes mellitus (GDM) in third trimester Patient with CBG of 156. She states she woke up about two hours ago and had half a glass of juicy juice, no food. See baby scripts data for CBGs. I told her I recommend starting on insulin. We were able to get in to dm education again in two days to start insulin. Pt states she knows she's not eating what she needs to and will start to try. If CBGs are similar to what they are today, continue with the regimen I sent in (Lantus due to shellfish allergy 35 qhs and aspart 29 tidac)  I d/w her the importance of good glycemic control with her and risk of IUFD. I told her this is also why she's LGA and I d/w her that she may need inpatient admission if CBGs are still high.    Preterm labor symptoms and general obstetric precautions including but not limited to vaginal bleeding, contractions, leaking of fluid and fetal movement were reviewed in detail with the patient. Please refer to After Visit Summary for other counseling recommendations.  Return in about 6 days (around 05/27/2019) for high risk, in person.   Aletha Halim, MD

## 2019-05-22 LAB — HEMOGLOBIN A1C
Est. average glucose Bld gHb Est-mCnc: 151 mg/dL
Hgb A1c MFr Bld: 6.9 % — ABNORMAL HIGH (ref 4.8–5.6)

## 2019-05-22 LAB — VITAMIN D 25 HYDROXY (VIT D DEFICIENCY, FRACTURES): Vit D, 25-Hydroxy: 14.1 ng/mL — ABNORMAL LOW (ref 30.0–100.0)

## 2019-05-23 ENCOUNTER — Other Ambulatory Visit: Payer: Self-pay

## 2019-05-23 ENCOUNTER — Encounter: Payer: Medicaid Other | Admitting: Registered"

## 2019-05-23 ENCOUNTER — Encounter: Payer: Self-pay | Admitting: Registered"

## 2019-05-23 DIAGNOSIS — O24410 Gestational diabetes mellitus in pregnancy, diet controlled: Secondary | ICD-10-CM

## 2019-05-23 NOTE — Patient Instructions (Addendum)
Great work on making dietary changes that are helping control your blood sugar. Consider adding protein your breakfast such as eggs. Continue with your plan to increase your salad intake. Since the last few days the evening numbers are elevated, consider having a routine after work that would help you wind down and relax. Stress may be playing a part in the elevated numbers at night. Make sure your baby scrips information is up to date before your next appointment with MD.

## 2019-05-23 NOTE — Progress Notes (Signed)
Medical Nutrition Therapy:  Appt start time: 3790 end time:  2409.  Assessment:  Primary concerns today: Potentially needs insulin for pregnancy.  Per Dr. Thomes Dinning notes: "I told her I recommend starting on insulin. We were able to get in to dm education again in two days to start insulin. Pt states she knows she's not eating what she needs to and will start to try. If CBGs are similar to what they are today, continue with the regimen I sent in (Lantus due to shellfish allergy 35 qhs and aspart 29 tidac)"  Patient states she had nutrition counseling at a previous appointment, has changed her diet and within 2 days has been able to bring majority of BG numbers into range. Patient wanted to understand the insulin prescribed, but would like to have a few more days with changes to see if she can manage with diet and lifestyle. Although PCOS may make it more difficult, it appears she may be able to accomplish this goal.  Patient works in [place] setting and states it would be very difficult for her to inject meal time insulin.   RD described action of Lantus and aspart insulin and injection technique. RD does not have sample pens to demonstrate but will be available on Dec 24 if patient needs further assistance.  SMBG reported values: date FBG Br Lunch Dinner 12/14 101 145* 232 - 12/15 - 184 133 - 12/16 - 86 90 145 12/17 90 87 120 145  *On Dec 14 BG in office was 156 mg/dL, patient states she drank juice in the morning before appointment but has stopped drinking juice since then. 2 hrs after breakfast she states it came down to 145 mg/dL.  Patient states she eats dinner after work about 12-1 am. Takes FBG at 9 am. Pt reports she is more active in the morning between breakfast and lunch.   Dinner last night Carb estimation 52 g Mrs. Paul fish patty 17 1 slice wheat bread 15 Slaw (without sugar) 5 sm bag chips  15 Diet soda  0  MEDICATIONS: reviewed including potentially Lantus  (due to shellfish allergy) 35 qhs and aspart 29 tidac   DIETARY INTAKE:  24-hr recall:  B ( AM): oatmeal  Snk ( AM): PB crackers  L ( PM): large salad with grilled chicken Snk ( PM): crackers D ( PM): detailed above Snk ( PM): none Beverages: water, diet soda  Usual physical activity: active at work  Progress Towards Goal(s):  New goals.  Barriers to learning/adherence to lifestyle change: none  Demonstrated degree of understanding via:  Teach Back   Monitoring/Evaluation:  Dietary intake, exercise, BG, and body weight prn.

## 2019-05-25 ENCOUNTER — Other Ambulatory Visit: Payer: Self-pay | Admitting: Obstetrics and Gynecology

## 2019-05-25 ENCOUNTER — Encounter: Payer: Self-pay | Admitting: Obstetrics and Gynecology

## 2019-05-25 DIAGNOSIS — R7989 Other specified abnormal findings of blood chemistry: Secondary | ICD-10-CM | POA: Insufficient documentation

## 2019-05-25 MED ORDER — VITAMIN D (ERGOCALCIFEROL) 1.25 MG (50000 UNIT) PO CAPS: 50000.0000 [IU] | ORAL_CAPSULE | ORAL | 0 refills | Status: DC

## 2019-05-26 MED ORDER — "INSULIN SYRINGE 31G X 5/16"" 1 ML MISC": 1.0000 | Freq: Four times a day (QID) | 3 refills | Status: DC

## 2019-05-27 ENCOUNTER — Telehealth: Payer: Self-pay | Admitting: *Deleted

## 2019-05-27 NOTE — Telephone Encounter (Signed)
Unable to speak to patient on cell phone, mailbox was full. Sent insulin instruction handout to patient via My Chart in case she needed it.

## 2019-05-27 NOTE — Progress Notes (Signed)
Insulin Instruction  Patient was seen on 05/23/2019 for insulin instruction.  MD orders are: Novolog 29 units 3x/day before meals and Lantus 35 units at bedtime. The following learning objectives were met by the patient during this visit:   Insulin Action of Rapid and Long acting insulins  Reviewed syringe & vial  Hygiene and storage  Drawing up single doses if using vials   Single dose only, Instructed not to mix insulin  Rotation of Sites  Hypoglycemia- need to review with patient, will try to contact via phone on Thursday if not sooner.  Record keeping and MD follow up  Patient demonstrated understanding of insulin and does not have concerns about insulin injection other than the inconvenience.  Patient received the following handouts:  Insulin Instruction Handout (sent via MyChart by Jeanie Sewer)                                        Patient expressed desire to wait until next week to start on insulin as Rx'd by MD  Patient will be seen for follow-up as needed.

## 2019-05-28 ENCOUNTER — Telehealth: Payer: Self-pay | Admitting: Registered"

## 2019-05-28 ENCOUNTER — Other Ambulatory Visit: Payer: Self-pay

## 2019-05-28 ENCOUNTER — Ambulatory Visit (INDEPENDENT_AMBULATORY_CARE_PROVIDER_SITE_OTHER): Payer: Medicaid Other | Admitting: Obstetrics and Gynecology

## 2019-05-28 VITALS — BP 141/76 | HR 86 | Wt 343.0 lb

## 2019-05-28 DIAGNOSIS — O0993 Supervision of high risk pregnancy, unspecified, third trimester: Secondary | ICD-10-CM

## 2019-05-28 DIAGNOSIS — Z3A33 33 weeks gestation of pregnancy: Secondary | ICD-10-CM

## 2019-05-28 DIAGNOSIS — O0990 Supervision of high risk pregnancy, unspecified, unspecified trimester: Secondary | ICD-10-CM

## 2019-05-28 NOTE — Telephone Encounter (Signed)
RD called patient to check if she had started insulin and if she needed further instruction. She stated she got a call from the pharmacy today that the syringes were in, but she is waiting to pick up supplies because in her appointment with Dr. Ilda Basset today, he decided she is well controlled with diet/exercise. Pt states MD will check her BG log again next week to make sure her BG is staying well controlled. Rd also wanted to make sure she had information on hypoglycemia and rule of 15.  RD sent Patient more information via MyChart and an offer to have her practice insulin injection technique if she desires.

## 2019-05-28 NOTE — Progress Notes (Signed)
Prenatal Visit Note Date: 05/28/2019 Clinic: [facility]  CBG log book visit  Subjective:  Tricia Ray is a 28 y.o. G1P0 at [redacted]w[redacted]d being seen today for ongoing prenatal care.  She is currently monitored for the following issues for this high-risk pregnancy and has Supervision of high risk pregnancy, antepartum; GDM (gestational diabetes mellitus); UTI (urinary tract infection) during pregnancy; Asthma; PCOS (polycystic ovarian syndrome); Obesity affecting pregnancy; Chronic hypertension during pregnancy; BMI 50.0-59.9, adult ([place]); LGA (large for gestational age) fetus affecting management of mother; and Low vitamin D level on their problem list.  Patient reports no complaints.   Contractions: Not present. Vag. Bleeding: None.  Movement: Present. Denies leaking of fluid.   The following portions of the patient's history were reviewed and updated as appropriate: allergies, current medications, past family history, past medical history, past social history, past surgical history and problem list. Problem list updated.  Objective:   Vitals:   05/28/19 0845  BP: (!) 141/76  Pulse: 86  Weight: (!) 343 lb (155.6 kg)    Fetal Status: Fetal Heart Rate (bpm): 139   Movement: Present     General:  Alert, oriented and cooperative. Patient is in no acute distress.  Skin: Skin is warm and dry. No rash noted.   Cardiovascular: Normal heart rate noted  Respiratory: Normal respiratory effort, no problems with respiration noted  Abdomen: Soft, gravid, appropriate for gestational age. Pain/Pressure: Absent     Pelvic:  Cervical exam deferred        Extremities: Normal range of motion.  Edema: Trace  Mental Status: Normal mood and affect. Normal behavior. Normal judgment and thought content.   Urinalysis:      Assessment and Plan:  Pregnancy: G1P0 at [redacted]w[redacted]d  See last note from me on 12/16  GDM Last seen on 12/16 by me and then by DM education on 12/18. Pt states since she  saw me last she's been doing postprandial walking and watching her diet. Her CBGs are significantly improved without meds. Continue to follow closely. Has nst/bpp tomorrow  Pt states she's going to pick up vitamin d today   Preterm labor symptoms and general obstetric precautions including but not limited to vaginal bleeding, contractions, leaking of fluid and fetal movement were reviewed in detail with the patient. Please refer to After Visit Summary for other counseling recommendations.  Return in about 8 days (around 06/05/2019) for high risk, in person.   Aletha Halim, MD

## 2019-05-29 ENCOUNTER — Ambulatory Visit (HOSPITAL_COMMUNITY)
Admission: RE | Admit: 2019-05-29 | Discharge: 2019-05-29 | Disposition: A | Discharge status: Home / Self Care | Payer: Medicaid Other | Source: Ambulatory Visit | Attending: Obstetrics and Gynecology | Admitting: Obstetrics and Gynecology

## 2019-05-29 ENCOUNTER — Ambulatory Visit (HOSPITAL_COMMUNITY): Payer: Medicaid Other | Admitting: *Deleted

## 2019-05-29 ENCOUNTER — Encounter (HOSPITAL_COMMUNITY): Payer: Self-pay | Admitting: *Deleted

## 2019-05-29 DIAGNOSIS — E282 Polycystic ovarian syndrome: Secondary | ICD-10-CM | POA: Insufficient documentation

## 2019-05-29 DIAGNOSIS — O0990 Supervision of high risk pregnancy, unspecified, unspecified trimester: Secondary | ICD-10-CM

## 2019-05-29 DIAGNOSIS — O99213 Obesity complicating pregnancy, third trimester: Secondary | ICD-10-CM

## 2019-05-29 DIAGNOSIS — O2343 Unspecified infection of urinary tract in pregnancy, third trimester: Secondary | ICD-10-CM

## 2019-05-29 DIAGNOSIS — O24410 Gestational diabetes mellitus in pregnancy, diet controlled: Secondary | ICD-10-CM | POA: Diagnosis not present

## 2019-05-29 DIAGNOSIS — J45909 Unspecified asthma, uncomplicated: Secondary | ICD-10-CM

## 2019-05-29 DIAGNOSIS — O10013 Pre-existing essential hypertension complicating pregnancy, third trimester: Secondary | ICD-10-CM

## 2019-05-29 DIAGNOSIS — Z3A33 33 weeks gestation of pregnancy: Secondary | ICD-10-CM

## 2019-05-29 DIAGNOSIS — O99891 Other specified diseases and conditions complicating pregnancy: Secondary | ICD-10-CM

## 2019-05-29 DIAGNOSIS — O24419 Gestational diabetes mellitus in pregnancy, unspecified control: Secondary | ICD-10-CM | POA: Insufficient documentation

## 2019-06-02 ENCOUNTER — Other Ambulatory Visit (HOSPITAL_COMMUNITY): Payer: Self-pay | Admitting: *Deleted

## 2019-06-02 DIAGNOSIS — O10919 Unspecified pre-existing hypertension complicating pregnancy, unspecified trimester: Secondary | ICD-10-CM

## 2019-06-04 ENCOUNTER — Ambulatory Visit (INDEPENDENT_AMBULATORY_CARE_PROVIDER_SITE_OTHER): Payer: Medicaid Other | Admitting: Obstetrics & Gynecology

## 2019-06-04 ENCOUNTER — Other Ambulatory Visit: Payer: Self-pay

## 2019-06-04 VITALS — BP 144/84 | HR 90 | Wt 355.4 lb

## 2019-06-04 DIAGNOSIS — O0993 Supervision of high risk pregnancy, unspecified, third trimester: Secondary | ICD-10-CM

## 2019-06-04 DIAGNOSIS — O10919 Unspecified pre-existing hypertension complicating pregnancy, unspecified trimester: Secondary | ICD-10-CM

## 2019-06-04 DIAGNOSIS — O10913 Unspecified pre-existing hypertension complicating pregnancy, third trimester: Secondary | ICD-10-CM

## 2019-06-04 DIAGNOSIS — O24419 Gestational diabetes mellitus in pregnancy, unspecified control: Secondary | ICD-10-CM

## 2019-06-04 DIAGNOSIS — Z3A34 34 weeks gestation of pregnancy: Secondary | ICD-10-CM

## 2019-06-04 DIAGNOSIS — O3660X0 Maternal care for excessive fetal growth, unspecified trimester, not applicable or unspecified: Secondary | ICD-10-CM

## 2019-06-04 DIAGNOSIS — O99213 Obesity complicating pregnancy, third trimester: Secondary | ICD-10-CM

## 2019-06-04 DIAGNOSIS — O3663X0 Maternal care for excessive fetal growth, third trimester, not applicable or unspecified: Secondary | ICD-10-CM

## 2019-06-04 DIAGNOSIS — Z6841 Body Mass Index (BMI) 40.0 and over, adult: Secondary | ICD-10-CM

## 2019-06-04 DIAGNOSIS — O0990 Supervision of high risk pregnancy, unspecified, unspecified trimester: Secondary | ICD-10-CM

## 2019-06-04 MED ORDER — METFORMIN HCL 500 MG PO TABS: ORAL_TABLET | ORAL | 3 refills | Status: DC

## 2019-06-04 MED ORDER — LABETALOL HCL 200 MG PO TABS: 400.0000 mg | ORAL_TABLET | Freq: Two times a day (BID) | ORAL | 3 refills | Status: DC

## 2019-06-04 NOTE — Progress Notes (Signed)
   PRENATAL VISIT NOTE  Subjective:  Tricia Ray is a 28 y.o. G1P0 at [redacted]w[redacted]d being seen today for ongoing prenatal care.  She is currently monitored for the following issues for this high-risk pregnancy and has Supervision of high risk pregnancy, antepartum; GDM (gestational diabetes mellitus); UTI (urinary tract infection) during pregnancy; Asthma; PCOS (polycystic ovarian syndrome); Obesity affecting pregnancy; Chronic hypertension during pregnancy; BMI 50.0-59.9, adult ([place]); LGA (large for gestational age) fetus affecting management of mother; and Low vitamin D level on their problem list.  Patient reports no complaints.  Contractions: Irritability. Vag. Bleeding: None.  Movement: Present. Denies leaking of fluid.   The following portions of the patient's history were reviewed and updated as appropriate: allergies, current medications, past family history, past medical history, past social history, past surgical history and problem list.   Objective:   Vitals:   06/04/19 1446  BP: (!) 144/84  Pulse: 90  Weight: (!) 355 lb 6.4 oz (161.2 kg)    Fetal Status: Fetal Heart Rate (bpm): 144   Movement: Present     General:  Alert, oriented and cooperative. Patient is in no acute distress.  Skin: Skin is warm and dry. No rash noted.   Cardiovascular: Normal heart rate noted  Respiratory: Normal respiratory effort, no problems with respiration noted  Abdomen: Soft, gravid, appropriate for gestational age.  Pain/Pressure: Present     Pelvic: Cervical exam deferred        Extremities: Normal range of motion.  Edema: Trace  Mental Status: Normal mood and affect. Normal behavior. Normal judgment and thought content.   Assessment and Plan:  Pregnancy: G1P0 at [redacted]w[redacted]d 1. Supervision of high risk pregnancy, antepartum  3. Chronic hypertension during pregnancy - on baby asa and labetalol - I will increase it to 400 mg BID  4. Excessive fetal growth affecting management of pregnancy,  antepartum, single or unspecified fetus  5. Obesity affecting pregnancy in third trimester   6. Gestational diabetes mellitus (GDM) in third trimester, gestational diabetes method of control unspecified - she is on no meds currently. - I have added metformin 500 mg qhs to help with her fastings  Preterm labor symptoms and general obstetric precautions including but not limited to vaginal bleeding, contractions, leaking of fluid and fetal movement were reviewed in detail with the patient. Please refer to After Visit Summary for other counseling recommendations.   Return in about 1 week (around 06/11/2019) for in person, cultures at next visit.  Future Appointments  Date Time Provider [place]  06/05/2019  7:30 AM [place] M[facility]-US  06/05/2019  7:30 AM WH-MFC [place] 4 WH-MFCUS MFC-US  06/12/2019 10:30 AM [facility]-MFC NURSE WH-MFC MFC-US  06/12/2019 10:30 AM [facility]-MFC [place] 3 WH-MFCUS MFC-US    Emily Filbert, MD

## 2019-06-05 ENCOUNTER — Encounter (HOSPITAL_COMMUNITY): Payer: Self-pay

## 2019-06-05 ENCOUNTER — Ambulatory Visit (HOSPITAL_COMMUNITY): Payer: Medicaid Other | Admitting: *Deleted

## 2019-06-05 ENCOUNTER — Ambulatory Visit (HOSPITAL_COMMUNITY)
Admission: RE | Admit: 2019-06-05 | Discharge: 2019-06-05 | Disposition: A | Discharge status: Home / Self Care | Payer: Medicaid Other | Source: Ambulatory Visit | Attending: Obstetrics and Gynecology | Admitting: Obstetrics and Gynecology

## 2019-06-05 DIAGNOSIS — Z3A34 34 weeks gestation of pregnancy: Secondary | ICD-10-CM

## 2019-06-05 DIAGNOSIS — O0990 Supervision of high risk pregnancy, unspecified, unspecified trimester: Secondary | ICD-10-CM

## 2019-06-05 DIAGNOSIS — O99213 Obesity complicating pregnancy, third trimester: Secondary | ICD-10-CM

## 2019-06-05 DIAGNOSIS — E282 Polycystic ovarian syndrome: Secondary | ICD-10-CM | POA: Insufficient documentation

## 2019-06-05 DIAGNOSIS — O24415 Gestational diabetes mellitus in pregnancy, controlled by oral hypoglycemic drugs: Secondary | ICD-10-CM

## 2019-06-05 DIAGNOSIS — O10919 Unspecified pre-existing hypertension complicating pregnancy, unspecified trimester: Secondary | ICD-10-CM

## 2019-06-05 DIAGNOSIS — O99891 Other specified diseases and conditions complicating pregnancy: Secondary | ICD-10-CM

## 2019-06-05 DIAGNOSIS — J45909 Unspecified asthma, uncomplicated: Secondary | ICD-10-CM

## 2019-06-05 DIAGNOSIS — O24419 Gestational diabetes mellitus in pregnancy, unspecified control: Secondary | ICD-10-CM

## 2019-06-05 DIAGNOSIS — O10013 Pre-existing essential hypertension complicating pregnancy, third trimester: Secondary | ICD-10-CM | POA: Diagnosis not present

## 2019-06-11 ENCOUNTER — Other Ambulatory Visit: Payer: Self-pay

## 2019-06-11 ENCOUNTER — Ambulatory Visit (INDEPENDENT_AMBULATORY_CARE_PROVIDER_SITE_OTHER): Payer: Medicaid Other | Admitting: Obstetrics & Gynecology

## 2019-06-11 ENCOUNTER — Other Ambulatory Visit (HOSPITAL_COMMUNITY)
Admission: RE | Admit: 2019-06-11 | Discharge: 2019-06-11 | Disposition: A | Discharge status: Home / Self Care | Payer: Medicaid Other | Source: Ambulatory Visit | Attending: Obstetrics & Gynecology | Admitting: Obstetrics & Gynecology

## 2019-06-11 VITALS — BP 157/96 | HR 82 | Wt 357.8 lb

## 2019-06-11 DIAGNOSIS — O99213 Obesity complicating pregnancy, third trimester: Secondary | ICD-10-CM

## 2019-06-11 DIAGNOSIS — O10919 Unspecified pre-existing hypertension complicating pregnancy, unspecified trimester: Secondary | ICD-10-CM

## 2019-06-11 DIAGNOSIS — O0993 Supervision of high risk pregnancy, unspecified, third trimester: Secondary | ICD-10-CM

## 2019-06-11 DIAGNOSIS — O0990 Supervision of high risk pregnancy, unspecified, unspecified trimester: Secondary | ICD-10-CM

## 2019-06-11 DIAGNOSIS — O10913 Unspecified pre-existing hypertension complicating pregnancy, third trimester: Secondary | ICD-10-CM

## 2019-06-11 DIAGNOSIS — O24415 Gestational diabetes mellitus in pregnancy, controlled by oral hypoglycemic drugs: Secondary | ICD-10-CM

## 2019-06-11 MED ORDER — LABETALOL HCL 200 MG PO TABS: 600.0000 mg | ORAL_TABLET | Freq: Two times a day (BID) | ORAL | 3 refills | Status: DC

## 2019-06-11 NOTE — Progress Notes (Signed)
   PRENATAL VISIT NOTE  Subjective:  Tricia Ray is a 29 y.o. G1P0 at [redacted]w[redacted]d being seen today for ongoing prenatal care.  She is currently monitored for the following issues for this high-risk pregnancy and has Supervision of high risk pregnancy, antepartum; GDM (gestational diabetes mellitus); UTI (urinary tract infection) during pregnancy; Asthma; PCOS (polycystic ovarian syndrome); Obesity affecting pregnancy; Chronic hypertension during pregnancy; BMI 50.0-59.9, adult (HCC); LGA (large for gestational age) fetus affecting management of mother; and Low vitamin D level on their problem list.  Patient reports cant sleep.  Contractions: Irritability. Vag. Bleeding: None.  Movement: Present. Denies leaking of fluid.   The following portions of the patient's history were reviewed and updated as appropriate: allergies, current medications, past family history, past medical history, past social history, past surgical history and problem list.   Objective:   Vitals:   06/11/19 1441  BP: (!) 157/96  Pulse: 82  Weight: (!) 357 lb 12.8 oz (162.3 kg)    Fetal Status: Fetal Heart Rate (bpm): 153   Movement: Present     General:  Alert, oriented and cooperative. Patient is in no acute distress.  Skin: Skin is warm and dry. No rash noted.   Cardiovascular: Normal heart rate noted  Respiratory: Normal respiratory effort, no problems with respiration noted  Abdomen: Soft, gravid, appropriate for gestational age.  Pain/Pressure: Present     Pelvic: Cervical exam deferred        Extremities: Normal range of motion.  Edema: Moderate pitting, indentation subsides rapidly  Mental Status: Normal mood and affect. Normal behavior. Normal judgment and thought content.   Assessment and Plan:  Pregnancy: G1P0 at [redacted]w[redacted]d 1. Supervision of high risk pregnancy, antepartum Routine testing - Culture, beta strep (group b only) - GC/Chlamydia probe amp ([place])not at [facility] - labetalol (NORMODYNE) 200 MG  tablet; Take 3 tablets (600 mg total) by mouth 2 (two) times daily.  Dispense: 60 tablet; Refill: 3  2. Chronic hypertension during pregnancy Increase labetalol to 600 mg BID  3. Obesity affecting pregnancy in third trimester   4. Gestational diabetes mellitus (GDM) in third trimester controlled on oral hypoglycemic drug Good control FBS and PP most in range Considering delivery after 37 weeks Preterm labor symptoms and general obstetric precautions including but not limited to vaginal bleeding, contractions, leaking of fluid and fetal movement were reviewed in detail with the patient. Please refer to After Visit Summary for other counseling recommendations.   Return in about 1 week (around 06/18/2019).  Future Appointments  Date Time Provider Department Center  06/12/2019  9:00 AM [facility]-MFC NURSE WH-MFC MFC-US  06/12/2019  9:00 AM [facility]-MFC [place] 3 WH-MFCUS MFC-US    Scheryl Darter, MD

## 2019-06-11 NOTE — Patient Instructions (Signed)

## 2019-06-12 ENCOUNTER — Ambulatory Visit (HOSPITAL_COMMUNITY)
Admission: RE | Admit: 2019-06-12 | Discharge: 2019-06-12 | Disposition: A | Discharge status: Home / Self Care | Payer: Medicaid Other | Source: Ambulatory Visit | Attending: Obstetrics and Gynecology | Admitting: Obstetrics and Gynecology

## 2019-06-12 ENCOUNTER — Ambulatory Visit (HOSPITAL_COMMUNITY): Payer: Medicaid Other

## 2019-06-12 ENCOUNTER — Encounter (HOSPITAL_COMMUNITY): Payer: Self-pay

## 2019-06-12 ENCOUNTER — Ambulatory Visit (HOSPITAL_COMMUNITY): Payer: Medicaid Other | Admitting: *Deleted

## 2019-06-12 DIAGNOSIS — O99213 Obesity complicating pregnancy, third trimester: Secondary | ICD-10-CM

## 2019-06-12 DIAGNOSIS — O24415 Gestational diabetes mellitus in pregnancy, controlled by oral hypoglycemic drugs: Secondary | ICD-10-CM | POA: Diagnosis present

## 2019-06-12 DIAGNOSIS — O10013 Pre-existing essential hypertension complicating pregnancy, third trimester: Secondary | ICD-10-CM

## 2019-06-12 DIAGNOSIS — O10919 Unspecified pre-existing hypertension complicating pregnancy, unspecified trimester: Secondary | ICD-10-CM | POA: Diagnosis present

## 2019-06-12 DIAGNOSIS — E282 Polycystic ovarian syndrome: Secondary | ICD-10-CM

## 2019-06-12 DIAGNOSIS — Z362 Encounter for other antenatal screening follow-up: Secondary | ICD-10-CM | POA: Diagnosis not present

## 2019-06-12 DIAGNOSIS — O99891 Other specified diseases and conditions complicating pregnancy: Secondary | ICD-10-CM | POA: Diagnosis not present

## 2019-06-12 DIAGNOSIS — O0990 Supervision of high risk pregnancy, unspecified, unspecified trimester: Secondary | ICD-10-CM

## 2019-06-12 DIAGNOSIS — Z3A34 34 weeks gestation of pregnancy: Secondary | ICD-10-CM

## 2019-06-12 DIAGNOSIS — J45909 Unspecified asthma, uncomplicated: Secondary | ICD-10-CM

## 2019-06-12 LAB — GC/CHLAMYDIA PROBE AMP (~~LOC~~) NOT AT ARMC
Chlamydia: NEGATIVE
Comment: NEGATIVE
Comment: NORMAL
Neisseria Gonorrhea: NEGATIVE

## 2019-06-13 ENCOUNTER — Encounter: Payer: Self-pay | Admitting: Family Medicine

## 2019-06-13 NOTE — Addendum Note (Signed)
Addended by: Reva Bores on: 06/13/2019 02:24 PM   Modules accepted: Orders, SmartSet

## 2019-06-14 LAB — CULTURE, BETA STREP (GROUP B ONLY): Strep Gp B Culture: POSITIVE — AB

## 2019-06-16 ENCOUNTER — Telehealth (HOSPITAL_COMMUNITY): Payer: Self-pay | Admitting: *Deleted

## 2019-06-16 ENCOUNTER — Other Ambulatory Visit: Payer: Self-pay | Admitting: Family Medicine

## 2019-06-16 NOTE — Telephone Encounter (Signed)
Preadmission screen  

## 2019-06-19 ENCOUNTER — Other Ambulatory Visit (HOSPITAL_COMMUNITY)
Admission: RE | Admit: 2019-06-19 | Discharge: 2019-06-19 | Disposition: A | Discharge status: Home / Self Care | Payer: Medicaid Other | Source: Ambulatory Visit | Attending: Family Medicine | Admitting: Family Medicine

## 2019-06-19 DIAGNOSIS — Z01812 Encounter for preprocedural laboratory examination: Secondary | ICD-10-CM | POA: Diagnosis present

## 2019-06-19 DIAGNOSIS — U071 COVID-19: Secondary | ICD-10-CM | POA: Insufficient documentation

## 2019-06-19 LAB — SARS CORONAVIRUS 2 (TAT 6-24 HRS): SARS Coronavirus 2: POSITIVE — AB

## 2019-06-20 ENCOUNTER — Telehealth: Payer: Self-pay

## 2019-06-20 ENCOUNTER — Encounter: Payer: Medicaid Other | Admitting: Family Medicine

## 2019-06-20 NOTE — Progress Notes (Signed)
Pt with positive covid result, contacted Dr. Virl Cagey office with results, will forward to MD and contact pt.

## 2019-06-20 NOTE — Telephone Encounter (Signed)
A nurse from [facility] called to advise that this patient tested positive for Covid-19 and has a induction scheduled for tomorrow 06/21/2019. Spoke to [employer] in Neurosurgeon and [facility] of patients status. Was advised that they will reach out to the patient to give her further instructions involving her care.  Sent patient a Clinical cytogeneticist message regarding this situation and advised her to look for a call regarding her next steps and to quarantine until she hears something.

## 2019-06-21 ENCOUNTER — Encounter (HOSPITAL_COMMUNITY): Payer: Self-pay | Admitting: Family Medicine

## 2019-06-21 ENCOUNTER — Inpatient Hospital Stay (HOSPITAL_COMMUNITY): Payer: Medicaid Other

## 2019-06-21 ENCOUNTER — Inpatient Hospital Stay (HOSPITAL_COMMUNITY)
Admission: AD | Admit: 2019-06-21 | Discharge: 2019-06-25 | DRG: 786 | Disposition: A | Discharge status: Home / Self Care | Payer: Medicaid Other | Attending: Obstetrics and Gynecology | Admitting: Obstetrics and Gynecology

## 2019-06-21 ENCOUNTER — Inpatient Hospital Stay (HOSPITAL_COMMUNITY): Payer: Medicaid Other | Admitting: Anesthesiology

## 2019-06-21 ENCOUNTER — Other Ambulatory Visit: Payer: Self-pay

## 2019-06-21 DIAGNOSIS — O3660X0 Maternal care for excessive fetal growth, unspecified trimester, not applicable or unspecified: Secondary | ICD-10-CM | POA: Diagnosis present

## 2019-06-21 DIAGNOSIS — O9952 Diseases of the respiratory system complicating childbirth: Secondary | ICD-10-CM | POA: Diagnosis present

## 2019-06-21 DIAGNOSIS — O24424 Gestational diabetes mellitus in childbirth, insulin controlled: Secondary | ICD-10-CM | POA: Diagnosis present

## 2019-06-21 DIAGNOSIS — O1413 Severe pre-eclampsia, third trimester: Secondary | ICD-10-CM

## 2019-06-21 DIAGNOSIS — I10 Essential (primary) hypertension: Secondary | ICD-10-CM | POA: Diagnosis present

## 2019-06-21 DIAGNOSIS — J45909 Unspecified asthma, uncomplicated: Secondary | ICD-10-CM | POA: Diagnosis present

## 2019-06-21 DIAGNOSIS — O9902 Anemia complicating childbirth: Secondary | ICD-10-CM | POA: Diagnosis present

## 2019-06-21 DIAGNOSIS — O99214 Obesity complicating childbirth: Secondary | ICD-10-CM | POA: Diagnosis present

## 2019-06-21 DIAGNOSIS — D649 Anemia, unspecified: Secondary | ICD-10-CM | POA: Diagnosis present

## 2019-06-21 DIAGNOSIS — O114 Pre-existing hypertension with pre-eclampsia, complicating childbirth: Principal | ICD-10-CM | POA: Diagnosis present

## 2019-06-21 DIAGNOSIS — O24415 Gestational diabetes mellitus in pregnancy, controlled by oral hypoglycemic drugs: Secondary | ICD-10-CM

## 2019-06-21 DIAGNOSIS — Z3A37 37 weeks gestation of pregnancy: Secondary | ICD-10-CM

## 2019-06-21 DIAGNOSIS — O3663X0 Maternal care for excessive fetal growth, third trimester, not applicable or unspecified: Secondary | ICD-10-CM | POA: Diagnosis present

## 2019-06-21 DIAGNOSIS — O1002 Pre-existing essential hypertension complicating childbirth: Secondary | ICD-10-CM | POA: Diagnosis present

## 2019-06-21 DIAGNOSIS — O24919 Unspecified diabetes mellitus in pregnancy, unspecified trimester: Secondary | ICD-10-CM | POA: Insufficient documentation

## 2019-06-21 DIAGNOSIS — U071 COVID-19: Secondary | ICD-10-CM | POA: Diagnosis present

## 2019-06-21 DIAGNOSIS — O9852 Other viral diseases complicating childbirth: Secondary | ICD-10-CM | POA: Diagnosis present

## 2019-06-21 DIAGNOSIS — O99824 Streptococcus B carrier state complicating childbirth: Secondary | ICD-10-CM | POA: Diagnosis present

## 2019-06-21 DIAGNOSIS — Z6841 Body Mass Index (BMI) 40.0 and over, adult: Secondary | ICD-10-CM

## 2019-06-21 DIAGNOSIS — O1410 Severe pre-eclampsia, unspecified trimester: Secondary | ICD-10-CM | POA: Diagnosis present

## 2019-06-21 DIAGNOSIS — E282 Polycystic ovarian syndrome: Secondary | ICD-10-CM

## 2019-06-21 DIAGNOSIS — O99213 Obesity complicating pregnancy, third trimester: Secondary | ICD-10-CM

## 2019-06-21 DIAGNOSIS — O0990 Supervision of high risk pregnancy, unspecified, unspecified trimester: Secondary | ICD-10-CM

## 2019-06-21 DIAGNOSIS — O24419 Gestational diabetes mellitus in pregnancy, unspecified control: Secondary | ICD-10-CM | POA: Diagnosis present

## 2019-06-21 DIAGNOSIS — O99210 Obesity complicating pregnancy, unspecified trimester: Secondary | ICD-10-CM | POA: Diagnosis present

## 2019-06-21 DIAGNOSIS — O26893 Other specified pregnancy related conditions, third trimester: Secondary | ICD-10-CM | POA: Diagnosis present

## 2019-06-21 DIAGNOSIS — O134 Gestational [pregnancy-induced] hypertension without significant proteinuria, complicating childbirth: Secondary | ICD-10-CM | POA: Diagnosis present

## 2019-06-21 LAB — CBC
HCT: 35.2 % — ABNORMAL LOW (ref 36.0–46.0)
HCT: 34 % — ABNORMAL LOW (ref 36.0–46.0)
Hemoglobin: 10.8 g/dL — ABNORMAL LOW (ref 12.0–15.0)
Hemoglobin: 10.9 g/dL — ABNORMAL LOW (ref 12.0–15.0)
MCH: 26.7 pg (ref 26.0–34.0)
MCH: 27 pg (ref 26.0–34.0)
MCHC: 30.7 g/dL (ref 30.0–36.0)
MCHC: 32.1 g/dL (ref 30.0–36.0)
MCV: 86.9 fL (ref 80.0–100.0)
MCV: 84.2 fL (ref 80.0–100.0)
Platelets: 251 10*3/uL (ref 150–400)
Platelets: 224 10*3/uL (ref 150–400)
RBC: 4.05 MIL/uL (ref 3.87–5.11)
RBC: 4.04 MIL/uL (ref 3.87–5.11)
RDW: 16.7 % — ABNORMAL HIGH (ref 11.5–15.5)
RDW: 16.4 % — ABNORMAL HIGH (ref 11.5–15.5)
WBC: 8.8 10*3/uL (ref 4.0–10.5)
WBC: 10.1 10*3/uL (ref 4.0–10.5)
nRBC: 0 % (ref 0.0–0.2)
nRBC: 0 % (ref 0.0–0.2)

## 2019-06-21 LAB — COMPREHENSIVE METABOLIC PANEL
ALT: 13 U/L (ref 0–44)
ALT: 14 U/L (ref 0–44)
AST: 16 U/L (ref 15–41)
AST: 13 U/L — ABNORMAL LOW (ref 15–41)
Albumin: 2.3 g/dL — ABNORMAL LOW (ref 3.5–5.0)
Albumin: 2.2 g/dL — ABNORMAL LOW (ref 3.5–5.0)
Alkaline Phosphatase: 149 U/L — ABNORMAL HIGH (ref 38–126)
Alkaline Phosphatase: 143 U/L — ABNORMAL HIGH (ref 38–126)
Anion gap: 9 (ref 5–15)
Anion gap: 9 (ref 5–15)
BUN: 6 mg/dL (ref 6–20)
BUN: 6 mg/dL (ref 6–20)
CO2: 21 mmol/L — ABNORMAL LOW (ref 22–32)
CO2: 22 mmol/L (ref 22–32)
Calcium: 8.7 mg/dL — ABNORMAL LOW (ref 8.9–10.3)
Calcium: 8.3 mg/dL — ABNORMAL LOW (ref 8.9–10.3)
Chloride: 107 mmol/L (ref 98–111)
Chloride: 105 mmol/L (ref 98–111)
Creatinine, Ser: 0.71 mg/dL (ref 0.44–1.00)
Creatinine, Ser: 0.66 mg/dL (ref 0.44–1.00)
GFR calc Af Amer: >60 mL/min (ref >60)
GFR calc Af Amer: >60 mL/min (ref >60)
GFR calc non Af Amer: >60 mL/min (ref >60)
GFR calc non Af Amer: >60 mL/min (ref >60)
Glucose, Bld: 117 mg/dL — ABNORMAL HIGH (ref 70–99)
Glucose, Bld: 98 mg/dL (ref 70–99)
Potassium: 3.8 mmol/L (ref 3.5–5.1)
Potassium: 4.1 mmol/L (ref 3.5–5.1)
Sodium: 137 mmol/L (ref 135–145)
Sodium: 136 mmol/L (ref 135–145)
Total Bilirubin: 0.5 mg/dL (ref 0.3–1.2)
Total Bilirubin: 0.4 mg/dL (ref 0.3–1.2)
Total Protein: 5.6 g/dL — ABNORMAL LOW (ref 6.5–8.1)
Total Protein: 5.4 g/dL — ABNORMAL LOW (ref 6.5–8.1)

## 2019-06-21 LAB — RPR: RPR Ser Ql: NONREACTIVE

## 2019-06-21 LAB — TYPE AND SCREEN
ABO/RH(D): O POS
Antibody Screen: NEGATIVE

## 2019-06-21 LAB — ABO/RH: ABO/RH(D): O POS

## 2019-06-21 LAB — GLUCOSE, CAPILLARY
Glucose-Capillary: 119 mg/dL — ABNORMAL HIGH (ref 70–99)
Glucose-Capillary: 126 mg/dL — ABNORMAL HIGH (ref 70–99)
Glucose-Capillary: 139 mg/dL — ABNORMAL HIGH (ref 70–99)
Glucose-Capillary: 113 mg/dL — ABNORMAL HIGH (ref 70–99)
Glucose-Capillary: 90 mg/dL (ref 70–99)
Glucose-Capillary: 91 mg/dL (ref 70–99)
Glucose-Capillary: 81 mg/dL (ref 70–99)
Glucose-Capillary: 74 mg/dL (ref 70–99)

## 2019-06-21 LAB — PROTEIN / CREATININE RATIO, URINE
Creatinine, Urine: 118.03 mg/dL
Protein Creatinine Ratio: 5.94 mg/mg{Cre} — ABNORMAL HIGH (ref 0.00–0.15)
Total Protein, Urine: 701 mg/dL

## 2019-06-21 MED ORDER — LACTATED RINGERS IV SOLN
500.0000 mL | Freq: Once | INTRAVENOUS | Status: AC
  Administered 2019-06-21: 500 mL via INTRAVENOUS

## 2019-06-21 MED ORDER — PHENYLEPHRINE 40 MCG/ML (10ML) SYRINGE FOR IV PUSH (FOR BLOOD PRESSURE SUPPORT)
80.0000 ug | PREFILLED_SYRINGE | INTRAVENOUS | Status: DC | PRN
  Filled 2019-06-21: qty 10

## 2019-06-21 MED ORDER — MELATONIN 3 MG PO TABS
6.0000 mg | ORAL_TABLET | Freq: Every evening | ORAL | Status: DC | PRN
  Administered 2019-06-22: 02:00:00 6 mg via ORAL
  Filled 2019-06-21 (×2): qty 2

## 2019-06-21 MED ORDER — FENTANYL-BUPIVACAINE-NACL 0.5-0.125-0.9 MG/250ML-% EP SOLN
12.0000 mL/h | EPIDURAL | Status: DC | PRN
  Administered 2019-06-22: 12 mL/h via EPIDURAL
  Filled 2019-06-21 (×2): qty 250

## 2019-06-21 MED ORDER — OXYTOCIN 40 UNITS IN NORMAL SALINE INFUSION - SIMPLE MED
1.0000 m[IU]/min | INTRAVENOUS | Status: DC
  Administered 2019-06-21: 2 m[IU]/min via INTRAVENOUS

## 2019-06-21 MED ORDER — FENTANYL CITRATE (PF) 100 MCG/2ML IJ SOLN
100.0000 ug | INTRAMUSCULAR | Status: DC | PRN
  Administered 2019-06-21 (×3): 100 ug via INTRAVENOUS
  Filled 2019-06-21 (×3): qty 2

## 2019-06-21 MED ORDER — FLEET ENEMA 7-19 GM/118ML RE ENEM: 1.0000 | ENEMA | RECTAL | Status: DC | PRN

## 2019-06-21 MED ORDER — NIFEDIPINE 10 MG PO CAPS
10.0000 mg | ORAL_CAPSULE | ORAL | Status: DC | PRN
  Administered 2019-06-21: 10 mg via ORAL
  Filled 2019-06-21: qty 1

## 2019-06-21 MED ORDER — SODIUM CHLORIDE (PF) 0.9 % IJ SOLN
INTRAMUSCULAR | Status: DC | PRN
  Administered 2019-06-21: 12 mL/h via EPIDURAL

## 2019-06-21 MED ORDER — BUTALBITAL-APAP-CAFFEINE 50-325-40 MG PO TABS
2.0000 | ORAL_TABLET | Freq: Once | ORAL | Status: AC
  Administered 2019-06-21: 2 via ORAL
  Filled 2019-06-21: qty 2

## 2019-06-21 MED ORDER — MISOPROSTOL 25 MCG QUARTER TABLET: 25.0000 ug | ORAL_TABLET | ORAL | Status: DC | PRN

## 2019-06-21 MED ORDER — TERBUTALINE SULFATE 1 MG/ML IJ SOLN: 0.2500 mg | Freq: Once | INTRAMUSCULAR | Status: DC | PRN

## 2019-06-21 MED ORDER — NIFEDIPINE 10 MG PO CAPS
20.0000 mg | ORAL_CAPSULE | ORAL | Status: DC | PRN
  Administered 2019-06-21 (×2): 20 mg via ORAL
  Filled 2019-06-21: qty 2

## 2019-06-21 MED ORDER — LACTATED RINGERS IV SOLN: INTRAVENOUS | Status: DC

## 2019-06-21 MED ORDER — PROMETHAZINE HCL 25 MG/ML IJ SOLN
25.0000 mg | Freq: Four times a day (QID) | INTRAMUSCULAR | Status: DC | PRN
  Filled 2019-06-21: qty 1

## 2019-06-21 MED ORDER — LABETALOL HCL 5 MG/ML IV SOLN
INTRAVENOUS | Status: AC
  Filled 2019-06-21: qty 4

## 2019-06-21 MED ORDER — LABETALOL HCL 200 MG PO TABS
600.0000 mg | ORAL_TABLET | Freq: Two times a day (BID) | ORAL | Status: DC
  Administered 2019-06-21 – 2019-06-23 (×4): 600 mg via ORAL
  Filled 2019-06-21 (×4): qty 3

## 2019-06-21 MED ORDER — CEFAZOLIN SODIUM-DEXTROSE 1-4 GM/50ML-% IV SOLN
1.0000 g | Freq: Three times a day (TID) | INTRAVENOUS | Status: DC
  Administered 2019-06-21 – 2019-06-23 (×6): 1 g via INTRAVENOUS
  Filled 2019-06-21 (×11): qty 50

## 2019-06-21 MED ORDER — OXYCODONE-ACETAMINOPHEN 5-325 MG PO TABS: 1.0000 | ORAL_TABLET | ORAL | Status: DC | PRN

## 2019-06-21 MED ORDER — NIFEDIPINE 10 MG PO CAPS
20.0000 mg | ORAL_CAPSULE | ORAL | Status: DC | PRN
  Filled 2019-06-21 (×2): qty 2

## 2019-06-21 MED ORDER — DIPHENHYDRAMINE HCL 50 MG/ML IJ SOLN
12.5000 mg | INTRAMUSCULAR | Status: DC | PRN
  Administered 2019-06-21: 12.5 mg via INTRAVENOUS
  Filled 2019-06-21 (×2): qty 1

## 2019-06-21 MED ORDER — OXYCODONE-ACETAMINOPHEN 5-325 MG PO TABS: 2.0000 | ORAL_TABLET | ORAL | Status: DC | PRN

## 2019-06-21 MED ORDER — LACTATED RINGERS IV SOLN: 500.0000 mL | Freq: Once | INTRAVENOUS | Status: DC

## 2019-06-21 MED ORDER — RAMELTEON 8 MG PO TABS
8.0000 mg | ORAL_TABLET | Freq: Every day | ORAL | Status: DC
  Filled 2019-06-21: qty 1

## 2019-06-21 MED ORDER — ONDANSETRON HCL 4 MG/2ML IJ SOLN
4.0000 mg | Freq: Four times a day (QID) | INTRAMUSCULAR | Status: DC | PRN
  Filled 2019-06-21: qty 2

## 2019-06-21 MED ORDER — INSULIN ASPART 100 UNIT/ML ~~LOC~~ SOLN
3.0000 [IU] | Freq: Once | SUBCUTANEOUS | Status: AC
  Administered 2019-06-21: 3 [IU] via SUBCUTANEOUS

## 2019-06-21 MED ORDER — SOD CITRATE-CITRIC ACID 500-334 MG/5ML PO SOLN
30.0000 mL | ORAL | Status: DC | PRN
  Administered 2019-06-23: 30 mL via ORAL
  Filled 2019-06-21: qty 30

## 2019-06-21 MED ORDER — OXYTOCIN 40 UNITS IN NORMAL SALINE INFUSION - SIMPLE MED
2.5000 [IU]/h | INTRAVENOUS | Status: DC
  Filled 2019-06-21 (×2): qty 1000

## 2019-06-21 MED ORDER — MAGNESIUM SULFATE BOLUS VIA INFUSION
4.0000 g | Freq: Once | INTRAVENOUS | Status: AC
  Administered 2019-06-21: 4 g via INTRAVENOUS
  Filled 2019-06-21: qty 1000

## 2019-06-21 MED ORDER — EPHEDRINE 5 MG/ML INJ: 10.0000 mg | INTRAVENOUS | Status: DC | PRN

## 2019-06-21 MED ORDER — LIDOCAINE HCL (PF) 1 % IJ SOLN
INTRAMUSCULAR | Status: DC | PRN
  Administered 2019-06-21 (×2): 4 mL via EPIDURAL

## 2019-06-21 MED ORDER — PHENYLEPHRINE 40 MCG/ML (10ML) SYRINGE FOR IV PUSH (FOR BLOOD PRESSURE SUPPORT): 80.0000 ug | PREFILLED_SYRINGE | INTRAVENOUS | Status: DC | PRN

## 2019-06-21 MED ORDER — MAGNESIUM SULFATE 40 GM/1000ML IV SOLN
2.0000 g/h | INTRAVENOUS | Status: DC
  Administered 2019-06-21 – 2019-06-22 (×3): 2 g/h via INTRAVENOUS
  Filled 2019-06-21 (×3): qty 1000

## 2019-06-21 MED ORDER — LACTATED RINGERS IV SOLN: 500.0000 mL | INTRAVENOUS | Status: DC | PRN

## 2019-06-21 MED ORDER — CEFAZOLIN SODIUM-DEXTROSE 2-4 GM/100ML-% IV SOLN
2.0000 g | Freq: Once | INTRAVENOUS | Status: AC
  Administered 2019-06-21: 2 g via INTRAVENOUS
  Filled 2019-06-21: qty 100

## 2019-06-21 MED ORDER — ACETAMINOPHEN 325 MG PO TABS
650.0000 mg | ORAL_TABLET | ORAL | Status: DC | PRN
  Administered 2019-06-21: 650 mg via ORAL
  Filled 2019-06-21: qty 2

## 2019-06-21 MED ORDER — LABETALOL HCL 5 MG/ML IV SOLN
40.0000 mg | INTRAVENOUS | Status: DC | PRN
  Administered 2019-06-21: 40 mg via INTRAVENOUS
  Filled 2019-06-21: qty 8

## 2019-06-21 MED ORDER — INSULIN ASPART 100 UNIT/ML ~~LOC~~ SOLN
6.0000 [IU] | Freq: Three times a day (TID) | SUBCUTANEOUS | Status: DC
  Administered 2019-06-21: 6 [IU] via SUBCUTANEOUS

## 2019-06-21 MED ORDER — OXYTOCIN BOLUS FROM INFUSION: 500.0000 mL | Freq: Once | INTRAVENOUS | Status: DC

## 2019-06-21 MED ORDER — MISOPROSTOL 50MCG HALF TABLET
50.0000 ug | ORAL_TABLET | ORAL | Status: DC | PRN
  Administered 2019-06-21 – 2019-06-22 (×4): 50 ug via BUCCAL
  Filled 2019-06-21 (×4): qty 1

## 2019-06-21 MED ORDER — LIDOCAINE HCL (PF) 1 % IJ SOLN: 30.0000 mL | INTRAMUSCULAR | Status: DC | PRN

## 2019-06-21 NOTE — H&P (Signed)
OBSTETRIC ADMISSION HISTORY AND PHYSICAL  Tricia Ray is a 29 y.o. female G1P0 with IUP at [redacted]w[redacted]d by 13 wk [place] presenting for IOL. She reports +FMs, No LOF, no VB, no blurry vision, headaches or peripheral edema, and RUQ pain.  She plans on breast and bottle feeding. She is undecided for birth control. She received her prenatal care at [facility]   Dating: By 13 wk [place] --->  Estimated Date of Delivery: 07/12/19  Sono: 1/7  @[redacted]w[redacted]d , CWD, normal anatomy, cephalic presentation, anterior placental lie, 3814g, >99% EFW  Prenatal History/Complications: cHTN  GDMA2/Type 2DM Maternal Obesity GBS positive COVID positive LGA  Past Medical History: Past Medical History:  Diagnosis Date  . Anemia   . Asthma   . Gestational diabetes   . Morbid obesity (HCC)   . PCOS (polycystic ovarian syndrome)   . Seasonal allergies     Past Surgical History: Past Surgical History:  Procedure Laterality Date  . HAND SURGERY      Obstetrical History: OB History    Gravida  1   Para      Term      Preterm      AB      Living        SAB      TAB      Ectopic      Multiple      Live Births              Social History: Social History   Socioeconomic History  . Marital status: Single    Spouse name: Not on file  . Number of children: Not on file  . Years of education: Not on file  . Highest education level: Not on file  Occupational History  . Not on file  Tobacco Use  . Smoking status: Never Smoker  . Smokeless tobacco: Never Used  Substance and Sexual Activity  . Alcohol use: Not Currently  . Drug use: Not Currently    Frequency: 2.0 times per week    Types: Marijuana    Comment: last use 2 weeks ago  . Sexual activity: Yes  Other Topics Concern  . Not on file  Social History Narrative  . Not on file   Social Determinants of Health   Financial Resource Strain:   . Difficulty of Paying Living Expenses: Not on file  Food Insecurity: No Food Insecurity  . Worried  About in the Last Year: Never true  . Ran Out of Food in the Last Year: Never true  Transportation Needs: No Transportation Needs  . Lack of Transportation (Medical): No  . Lack of Transportation (Non-Medical): No  Physical Activity:   . Days of Exercise per Week: Not on file  . Minutes of Exercise per Session: Not on file  Stress:   . Feeling of Stress : Not on file  Social Connections:   . Frequency of Communication with Friends and Family: Not on file  . Frequency of Social Gatherings with Friends and Family: Not on file  . Attends Religious Services: Not on file  . Active Member of Clubs or Organizations: Not on file  . Attends Programme researcher, broadcasting/film/video Meetings: Not on file  . Marital Status: Not on file    Family History: Family History  Problem Relation Age of Onset  . Cancer Mother   . Leukemia Mother   . Diabetes Mother     Allergies: Allergies  Allergen Reactions  . Shrimp [Shellfish Allergy]  Anaphylaxis    Itching and swelling in mounth  . Aleve [Naproxen Sodium] Hives and Swelling  . Cherry Hives, Itching and Swelling  . Penicillins     Medications Prior to Admission  Medication Sig Dispense Refill Last Dose  . Accu-Chek Softclix Lancets lancets Use as instructed 100 each 12   . aspirin EC 81 MG tablet Take 1 tablet (81 mg total) by mouth daily. Take after 12 weeks for prevention of preeclampsia later in pregnancy 300 tablet 2   . ferrous sulfate 325 (65 FE) MG tablet Take 1 tablet (325 mg total) by mouth daily with breakfast. 60 tablet 1   . glucose blood (ACCU-CHEK GUIDE) test strip Use as instructed 100 each 12   . insulin aspart (NOVOLOG) 100 UNIT/ML injection Inject 29 Units into the skin 3 (three) times daily before meals. (Patient not taking: Reported on 05/29/2019) 10 mL 12   . insulin glargine (LANTUS) 100 UNIT/ML injection Inject 0.35 mLs (35 Units total) into the skin at bedtime. (Patient not taking: Reported on 05/29/2019) 10 mL 11    . Insulin Syringe-Needle U-100 (INSULIN SYRINGE 1CC/31GX5/16") 31G X 5/16" 1 ML MISC 1 Device by Does not apply route 4 (four) times daily. (Patient not taking: Reported on 06/04/2019) 1 each 3   . labetalol (NORMODYNE) 200 MG tablet Take 3 tablets (600 mg total) by mouth 2 (two) times daily. 60 tablet 3   . metFORMIN (GLUCOPHAGE) 500 MG tablet Take 1 tablet at bedtime nightly 30 tablet 3   . Prenatal Vit-Fe Fumarate-FA (PRENATAL VITAMIN PO) Take by mouth.     . Vitamin D, Ergocalciferol, (DRISDOL) 1.25 MG (50000 UT) CAPS capsule Take 1 capsule (50,000 Units total) by mouth every 7 (seven) days. 12 capsule 0      Review of Systems   All systems reviewed and negative except as stated in HPI  Temperature 98.2 F (36.8 C), temperature source Axillary, last menstrual period 09/19/2018. General appearance: alert, cooperative and appears stated age Lungs: normal effort Heart: regular rate  Abdomen: soft, non-tender; bowel sounds normal Pelvic: gravid uterus Extremities: Homans sign is negative, no sign of DVT Presentation: cephalic by RN exam and BSUS Fetal monitoringBaseline: 140 bpm, Variability: Good {> 6 bpm), Accelerations: Reactive and Decelerations: Absent Uterine activity: None Dilation: Fingertip Effacement (%): Thick Station: -3 Exam by:: K.Cowher RN   Prenatal labs: ABO, Rh: --/--/O POS (01/16 0032) Antibody: NEG (01/16 0032) Rubella: Immune (07/23 0000) RPR: Nonreactive (11/17 1553)  HBsAg: Negative (07/23 0000)  HIV: Non-reactive (11/18 1053)  GBS: Positive/-- (01/06 1547)  3 hr Glucola abnormal  Genetic screening  Not completed Anatomy US WNL  Prenatal Transfer Tool  Maternal Diabetes: Yes:  Diabetes Type:  Insulin/Medication controlled Genetic Screening: Not done Maternal Ultrasounds/Referrals: Normal Fetal Ultrasounds or other Referrals:  None Maternal Substance Abuse:  No Significant Maternal Medications:  None Significant Maternal Lab Results: Group B  Strep positive  Results for orders placed or performed during the hospital encounter of 06/21/19 (from the past 24 hour(s))  Type and screen   Collection Time: 06/21/19 12:32 AM  Result Value Ref Range   ABO/RH(D) O POS    Antibody Screen NEG    Sample Expiration      06/24/2019,2359 Performed at [facility], [address], [place], [place] [address]   CBC   Collection Time: 06/21/19 12:37 AM  Result Value Ref Range   WBC 8.8 4.0 - 10.5 K/uL   RBC 4.05 3.87 - 5.11 MIL/uL  Hemoglobin 10.8 (L) 12.0 - 15.0 g/dL   HCT 71.0 (L) 62.6 - 94.8 %   MCV 86.9 80.0 - 100.0 fL   MCH 26.7 26.0 - 34.0 pg   MCHC 30.7 30.0 - 36.0 g/dL   RDW 54.6 (H) 27.0 - 35.0 %   Platelets 251 150 - 400 K/uL   nRBC 0.0 0.0 - 0.2 %  Comprehensive metabolic panel   Collection Time: 06/21/19 12:37 AM  Result Value Ref Range   Sodium 137 135 - 145 mmol/L   Potassium 3.8 3.5 - 5.1 mmol/L   Chloride 107 98 - 111 mmol/L   CO2 21 (L) 22 - 32 mmol/L   Glucose, Bld 117 (H) 70 - 99 mg/dL   BUN 6 6 - 20 mg/dL   Creatinine, Ser 0.93 0.44 - 1.00 mg/dL   Calcium 8.7 (L) 8.9 - 10.3 mg/dL   Total Protein 5.6 (L) 6.5 - 8.1 g/dL   Albumin 2.3 (L) 3.5 - 5.0 g/dL   AST 16 15 - 41 U/L   ALT 13 0 - 44 U/L   Alkaline Phosphatase 149 (H) 38 - 126 U/L   Total Bilirubin 0.5 0.3 - 1.2 mg/dL   GFR calc non Af Amer >60 >60 mL/min   GFR calc Af Amer >60 >60 mL/min   Anion gap 9 5 - 15  Glucose, capillary   Collection Time: 06/21/19  1:02 AM  Result Value Ref Range   Glucose-Capillary 119 (H) 70 - 99 mg/dL    Patient Active Problem List   Diagnosis Date Noted  . Diabetes in pregnancy 06/21/2019  . Chronic hypertension 06/21/2019  . COVID-19 virus infection 06/21/2019  . Low vitamin D level 05/25/2019  . BMI 50.0-59.9, adult (HCC) 05/21/2019  . LGA (large for gestational age) fetus affecting management of mother 05/21/2019  . Chronic hypertension during pregnancy 05/13/2019  . Obesity affecting pregnancy  05/09/2019  . Supervision of high risk pregnancy, antepartum 04/23/2019  . GDM (gestational diabetes mellitus) 04/23/2019  . UTI (urinary tract infection) during pregnancy 04/23/2019  . Asthma   . PCOS (polycystic ovarian syndrome)     Assessment/Plan:  Tricia Ray is a 29 y.o. G1P0 at [redacted]w[redacted]d here for IOL due to [facility] and poorly-controlled cHTN.  #Labor: Vertex by BSUS. Will start with buccal Cytotec which was given at 0118. Foley balloon placed with speculum into closed inner os. Anticipate several Cytotec and then eventual Pitocin/AROM. Discussed long nature of early primigravid induction and patient understanding. Anticipate SVD. #Pain: Per patient request #FWB: Cat I; EFW: 4000g #ID:  GBS pos; Ancef ordered (Patient unsure of exact reaction to PCN, possible hives, Clinda sensitivity not done; will monitor response to Ancef closely). #MOF: Both #MOC: Undecided #Circ:  NA; female #cHTN: Patient not on meds for cHTN prior to pregnancy but medication initiated at 31 weeks now on Labetalol 600 mg BID. Patient did not take her evening dose and presents with elevated BP's. Asymptomatic from Pre-E standpoint. CMP and Pr/Cr ordered. Will give PTA Labetalol now and follow closely.  #GDMA2: Insulin prescribed but patient has only been taking Metformin with decent control. A1c 6.9 on 12/16 and therefore patient likely with mixed picture and also has T2DM or pre-diabetic as well; needs post-natal testing and f/u. Carb modified diet currently. Checking sugars q4h latent labor and q2h active labor. Will start insulin or Endo Tool if consistently elevated.  #LGA: >99%tile on [place] 1/7; EFW 4000g today #Hx of asthma: Avoid Hemabate if needed for PPH #COVID positive:  Positive on 1/14. Asymptomatic currently; will draw labs if becomes symptomatic.   Barrington Ellison, MD [facility] Family Medicine Fellow, [facility] for [facility], [employer] [facility] 06/21/2019, 1:47 AM

## 2019-06-21 NOTE — Progress Notes (Signed)
Labor Progress Note Tricia Ray is a 29 y.o. G1P0 at [redacted]w[redacted]d presented for IOL for cHTN and GDMA2. S: Comfortable with epidural.   O:  BP 130/66   Pulse 69   Temp 97.6 F (36.4 C) (Axillary)   Resp 18   Ht 5\' 7"  (1.702 m)   Wt (!) 160.6 kg   LMP 09/19/2018   SpO2 100%   BMI 55.44 kg/m  EFM: 125, moderate variability, pos accels, no decels, reactive TOCO: q27m  CVE: Dilation: 4 Effacement (%): 50 Cervical Position: Posterior Station: -3 Presentation: Vertex Exam by:: [email or web address], RN    A&P: 29 y.o. G1P0 [redacted]w[redacted]d here for IOL for GDMA2 and cHTN which is now with superimposed Severe-Pre E.  #Labor: S/p Cytotec x2 and Foley bulb. Pitocin started at 1700; currently at  #Pain: IV pain meds; epidural when FB out if patient desires 8 milli-units. Pending next exam, will either stop Pitocin and give another Cytotec 30 minutes after if still thick/unchanged or cont Pitocin titration. Anticipate eventual SVD. #FWB: Cat I #GBS positive; Ancef #GDMA2: last glucose 74. Will discontinue insulin with meals as she has not been requiring this throughout the day. #cHTN with superimposed severe Pre-E: BP's now well-controlled on Labetalol 600 mg BID and has not required correction since initial presentation. Pr/Cr 5.94, previously 0.10. CBC and CMP WNL on admission and repeat this afternoon. Continue Magnesium and Procardia protocol. Intermittent headache relieved with Fioricet; difficult to assess whether due to Pre-E, Mag or COVID. #COVID: Continues to deny COVID symptoms. Vitals stable on room air. CXR negative for acute process earlier today.  [redacted]w[redacted]d, MD 8:35 PM

## 2019-06-21 NOTE — Anesthesia Preprocedure Evaluation (Addendum)
Anesthesia Evaluation  Patient identified by MRN, date of birth, ID band Patient awake    Reviewed: Allergy & Precautions, NPO status , Patient's Chart, lab work & pertinent test results  History of Anesthesia Complications Negative for: history of anesthetic complications  Airway Mallampati: II  TM Distance: >3 FB Neck ROM: Full    Dental no notable dental hx.    Pulmonary asthma ,  COVID+ 06/19/19   Pulmonary exam normal        Cardiovascular hypertension, Normal cardiovascular exam     Neuro/Psych negative neurological ROS  negative psych ROS   GI/Hepatic negative GI ROS, Neg liver ROS,   Endo/Other  diabetes, GestationalMorbid obesity (BMI 55)  Renal/GU negative Renal ROS  negative genitourinary   Musculoskeletal negative musculoskeletal ROS (+)   Abdominal   Peds  Hematology  (+) anemia , Hgb 10.8   Anesthesia Other Findings Day of surgery medications reviewed with patient.  Reproductive/Obstetrics (+) Pregnancy (Severe preE on Mg)                            Anesthesia Physical Anesthesia Plan  ASA: III  Anesthesia Plan: Epidural   Post-op Pain Management:    Induction:   PONV Risk Score and Plan: Treatment may vary due to age or medical condition  Airway Management Planned: Natural Airway  Additional Equipment:   Intra-op Plan:   Post-operative Plan:   Informed Consent: I have reviewed the patients History and Physical, chart, labs and discussed the procedure including the risks, benefits and alternatives for the proposed anesthesia with the patient or authorized representative who has indicated his/her understanding and acceptance.       Plan Discussed with:   Anesthesia Plan Comments:        Anesthesia Quick Evaluation

## 2019-06-21 NOTE — Progress Notes (Signed)
Labor Progress Note Eulonda Andalon is a 29 y.o. G1P0 at [redacted]w[redacted]d presented for IOL A2GDM, CHTN with SIPE  S:  Patient sleeping  O:  BP 114/64   Pulse 76   Temp 98.3 F (36.8 C) (Oral)   Resp 15   Ht 5\' 7"  (1.702 m)   Wt (!) 160.6 kg   LMP 09/19/2018   SpO2 97%   BMI 55.44 kg/m   Fetal Tracing:  Baseline: 115 Variability: moderate Accels: 15x15 Decels: none  Toco: ui   CVE: Dilation: Fingertip Effacement (%): Thick Cervical Position: Posterior Station: -3 Presentation: Vertex Exam by:: K.Cowher RN   A&P: 29 y.o. G1P0 [redacted]w[redacted]d IOL A2GDM, CHTN with SIPE #Labor: FB in place. Patient reporting 10/10 HA unrelieved with tylenol. Will try fioricet PO. Will repeat CBC, CMP.  BPs well controlled with Labetalol. CBG 90s. Will hold insulin at this time and recheck after lunch.   #Pain: IV fentanyl #FWB: Cat 1 #GBS positive  [redacted]w[redacted]d, CNM 1:09 PM

## 2019-06-21 NOTE — Progress Notes (Signed)
Labor Progress Note Tricia Ray is a 29 y.o. G1P0 at [redacted]w[redacted]d presented for IOL for A2GDM  S:  Patient comfortable in bed. FB in place.  O:  BP 121/66   Pulse 78   Temp 98.1 F (36.7 C) (Oral)   Resp 16   Ht 5\' 7"  (1.702 m)   Wt (!) 160.6 kg   LMP 09/19/2018   SpO2 97%   BMI 55.44 kg/m   Fetal Tracing:  Baseline: 125 Variability: moderate Accels: 15x15 Decels: none  Toco: ui   CVE: Dilation: Fingertip Effacement (%): Thick Cervical Position: Posterior Station: -3 Presentation: Vertex Exam by:: K.Cowher RN   A&P: 29 y.o. G1P0 [redacted]w[redacted]d IOL A2GDM, CHTN with SIPE #Labor: FB in place, continue cytotec.   CBGs better controlled on 6u insulin. Low threshold to start glucose stabilizer.   Patient appears to be short of breath while laying in the bed. Patient denies any difficulty breathing or chest pain. Discussed COVID at length and instructed patient to notify team if symptoms start or worsen.   #Pain: IV fentanyl, planning epidural #FWB: Cat 1 #GBS positive  [redacted]w[redacted]d, CNM 9:13 AM

## 2019-06-21 NOTE — Progress Notes (Signed)
Labor Progress Note Tricia Ray is a 29 y.o. G1P0 at [redacted]w[redacted]d presented for IOL for cHTN and GDMA2. S: Feeling cramping and has received 2 doses IV Fentanyl.  O:  BP 121/64   Pulse 79   Temp 98.2 F (36.8 C) (Axillary)   Resp 17   Ht 5\' 7"  (1.702 m)   Wt (!) 160.6 kg   LMP 09/19/2018   SpO2 97%   BMI 55.44 kg/m  EFM: 130, moderate variability, pos accels, no decels, reactive TOCO: q55m  CVE: Dilation: Fingertip Effacement (%): Thick Cervical Position: Posterior Station: -3 Presentation: Vertex Exam by:: K.Cowher RN   A&P: 29 y.o. G1P0 [redacted]w[redacted]d here for IOL for GDMA2 and cHTN which is now with superimposed Severe-Pre E.  #Labor: S/p Cytotec x1 and Foley bulb placement. Foley bulb still in place. Will give 2nd Cytotec when ctx space out. Anticipate eventual SVD. #Pain: IV pain meds; epidural when FB out if patient desires #FWB: Cat I #GBS positive; Ancef #GDMA2: last glucose 126; will recheck in 1 hour  #cHTN with superimposed severe Pre-E: Patient with poorly controlled HTN at baseline, at last visit in office BP 157/96 and Labetalol increased to 600 mg BID and patient reported not taking this yesterday as she did not know if she should on the day of her induction. Labetalol 600 mg BID resumed here. Patient with persistently elevated BP's > 1 hour thereafter. Pr/Cr ratio resulted at 5.94, previously 0.10 1 month prior. CBC and CMP WNL. Magnesium and Procardia protocol initiated. BP's now well within normal limits. Remains asymptomatic.   [redacted]w[redacted]d, MD 5:19 AM

## 2019-06-21 NOTE — Anesthesia Procedure Notes (Signed)
Epidural Patient location during procedure: OB Start time: 06/21/2019 4:23 PM End time: 06/21/2019 4:27 PM  Staffing Anesthesiologist: Kaylyn Layer, MD Performed: anesthesiologist   Preanesthetic Checklist Completed: patient identified, IV checked, risks and benefits discussed, monitors and equipment checked, pre-op evaluation and timeout performed  Epidural Patient position: sitting Prep: DuraPrep and site prepped and draped Patient monitoring: continuous pulse ox, blood pressure and heart rate Approach: midline Location: L3-L4 Injection technique: LOR air  Needle:  Needle type: Tuohy  Needle gauge: 17 G Needle length: 9 cm Needle insertion depth: 9 cm Catheter type: closed end flexible Catheter size: 19 Gauge Catheter at skin depth: 15 cm Test dose: negative and Other (1% lidocaine)  Assessment Events: blood not aspirated, injection not painful, no injection resistance, no paresthesia and negative IV test  Additional Notes Patient identified. Risks, benefits, and alternatives discussed with patient including but not limited to bleeding, infection, nerve damage, paralysis, failed block, incomplete pain control, headache, blood pressure changes, nausea, vomiting, reactions to medication, itching, and postpartum back pain. Confirmed with bedside nurse the patient's most recent platelet count. Confirmed with patient that they are not currently taking any anticoagulation, have any bleeding history, or any family history of bleeding disorders. Patient expressed understanding and wished to proceed. All questions were answered. Sterile technique was used throughout the entire procedure. Please see nursing notes for vital signs.   Crisp LOR after one needle redirection. Test dose was given through epidural catheter and negative prior to continuing to dose epidural or start infusion. Warning signs of high block given to the patient including shortness of breath, tingling/numbness in  hands, complete motor block, or any concerning symptoms with instructions to call for help. Patient was given instructions on fall risk and not to get out of bed. All questions and concerns addressed with instructions to call with any issues or inadequate analgesia.  Reason for block:procedure for pain

## 2019-06-21 NOTE — Progress Notes (Signed)
Eating modified carb breakfast

## 2019-06-21 NOTE — Progress Notes (Signed)
Dr. Morene Antu notified of BP 160-170/90-100 after Procardia protocol given. PCR lab results given to Dr. Morene Antu. Magnesium was mentioned but, no new orders received at this time. Will continue to monitor patient.

## 2019-06-22 DIAGNOSIS — O134 Gestational [pregnancy-induced] hypertension without significant proteinuria, complicating childbirth: Secondary | ICD-10-CM | POA: Diagnosis not present

## 2019-06-22 LAB — COMPREHENSIVE METABOLIC PANEL
ALT: 13 U/L (ref 0–44)
AST: 17 U/L (ref 15–41)
Albumin: 2.2 g/dL — ABNORMAL LOW (ref 3.5–5.0)
Alkaline Phosphatase: 150 U/L — ABNORMAL HIGH (ref 38–126)
Anion gap: 11 (ref 5–15)
BUN: <5 mg/dL — ABNORMAL LOW (ref 6–20)
CO2: 21 mmol/L — ABNORMAL LOW (ref 22–32)
Calcium: 8.1 mg/dL — ABNORMAL LOW (ref 8.9–10.3)
Chloride: 105 mmol/L (ref 98–111)
Creatinine, Ser: 0.83 mg/dL (ref 0.44–1.00)
GFR calc Af Amer: >60 mL/min (ref >60)
GFR calc non Af Amer: >60 mL/min (ref >60)
Glucose, Bld: 100 mg/dL — ABNORMAL HIGH (ref 70–99)
Potassium: 3.6 mmol/L (ref 3.5–5.1)
Sodium: 137 mmol/L (ref 135–145)
Total Bilirubin: 0.7 mg/dL (ref 0.3–1.2)
Total Protein: 5.8 g/dL — ABNORMAL LOW (ref 6.5–8.1)

## 2019-06-22 LAB — CBC
HCT: 36.7 % (ref 36.0–46.0)
Hemoglobin: 11.8 g/dL — ABNORMAL LOW (ref 12.0–15.0)
MCH: 27.3 pg (ref 26.0–34.0)
MCHC: 32.2 g/dL (ref 30.0–36.0)
MCV: 84.8 fL (ref 80.0–100.0)
Platelets: 244 10*3/uL (ref 150–400)
RBC: 4.33 MIL/uL (ref 3.87–5.11)
RDW: 16.7 % — ABNORMAL HIGH (ref 11.5–15.5)
WBC: 7.1 10*3/uL (ref 4.0–10.5)
nRBC: 0 % (ref 0.0–0.2)

## 2019-06-22 LAB — GLUCOSE, CAPILLARY
Glucose-Capillary: 112 mg/dL — ABNORMAL HIGH (ref 70–99)
Glucose-Capillary: 81 mg/dL (ref 70–99)
Glucose-Capillary: 87 mg/dL (ref 70–99)
Glucose-Capillary: 92 mg/dL (ref 70–99)
Glucose-Capillary: 94 mg/dL (ref 70–99)
Glucose-Capillary: 98 mg/dL (ref 70–99)

## 2019-06-22 LAB — MAGNESIUM: Magnesium: 4.6 mg/dL — ABNORMAL HIGH (ref 1.7–2.4)

## 2019-06-22 NOTE — Progress Notes (Addendum)
Labor Progress Note Tricia Ray is a 29 y.o. G1P0 at [redacted]w[redacted]d presented for IOL for cHTN and GDMA2. S: Comfortable with epidural but requests medication to assist with sleep.  O:  BP 122/86   Pulse 75   Temp 97.6 F (36.4 C) (Axillary)   Resp 18   Ht 5\' 7"  (1.702 m)   Wt (!) 160.6 kg   LMP 09/19/2018   SpO2 100%   BMI 55.44 kg/m  EFM: 125, moderate variability, pos accels, no decels, reactive TOCO: q92m  CVE: Dilation: 4 Effacement (%): 50 Cervical Position: Posterior Station: -3 Presentation: Vertex Exam by:: K.Cowher RN   A&P: 29 y.o. G1P0 [redacted]w[redacted]d here for IOL for GDMA2 and cHTN which is now with superimposed Severe-Pre E.  #Labor: S/p Cytotec x3 and Foley bulb. Pitocin started at 1700 yesterday. Discontinued at 2100 and gave 3rd dose of Cytotec. Will give 4th dose now. Anticipate restarting Pitocin thereafter. Anticipate eventual SVD. #FWB: Cat I #GBS positive; Ancef #GDMA2: last glucose 92 #cHTN with superimposed severe Pre-E: BP's now well-controlled on Labetalol 600 mg BID and has not required correction since initial presentation. Pr/Cr 5.94, previously 0.10. CBC and CMP WNL on admission and repeat this afternoon. Continue Magnesium and Procardia protocol. Intermittent headache relieved with Fioricet; difficult to assess whether due to Pre-E, Mag or COVID. (Held evening dose of Labetalol due to low-normal BP's; can give later PRN). #COVID: Continues to deny COVID symptoms. Vitals stable on room air. CXR negative for acute process earlier today.  2101, MD 1:52 AM

## 2019-06-22 NOTE — Progress Notes (Signed)
Labor Progress Note Tricia Ray is a 29 y.o. G1P0 at [redacted]w[redacted]d presented for IOL for cHTN and GDMA2. S: Comfortable and trying to sleep.   O:  BP (!) 130/55 (BP Location: Right Arm)   Pulse 78   Temp 97.6 F (36.4 C) (Axillary)   Resp 18   Ht 5\' 7"  (1.702 m)   Wt (!) 160.6 kg   LMP 09/19/2018   SpO2 100%   BMI 55.44 kg/m  EFM: 120, moderate variability, pos accels, no decels, reactive TOCO: difficult to trace currently   CVE: Dilation: 4 Effacement (%): 50 Cervical Position: Posterior Station: -3 Presentation: Vertex Exam by:: K.Cowher RN   A&P: 29 y.o. G1P0 [redacted]w[redacted]d here for IOL for GDMA2 and cHTN which is now with superimposed Severe-Pre E.  #Labor: S/p Cytotec x4 and Foley bulb. Pitocin started at 1700 yesterday. Discontinued at 2100 and gave 3rd and 4th dose of Cytotec. Pit restarted at 0545. AROM with likely IUPC when more favorable. Anticipate eventual SVD. #FWB: Cat I #GBS positive; Ancef #GDMA2: last glucose 87 #cHTN with superimposed severe Pre-E: BP's now well-controlled on Labetalol 600 mg BID and has not required correction since initial presentation. Pr/Cr 5.94, previously 0.10. CBC and CMP WNL on admission and repeat this afternoon. Continue Magnesium and Procardia protocol. Intermittent headache relieved with Fioricet; difficult to assess whether due to Pre-E, Mag or COVID. (Held evening dose of Labetalol due to low-normal BP's; can give later PRN). #COVID: Continues to deny COVID symptoms. Vitals stable on room air. CXR negative for acute process 1/16.  2/16, MD 6:06 AM

## 2019-06-22 NOTE — Progress Notes (Signed)
Labor Progress Note Tricia Ray is a 29 y.o. G1P0 at [redacted]w[redacted]d presented for IOL for cHTN and GDMA2. S: Epidural getting a little uneven but overall still comfortable and was able to sleep for a few hours overnight.   O:  BP (!) 114/52   Pulse 80   Temp 98.5 F (36.9 C) (Oral)   Resp 18   Ht 5\' 7"  (1.702 m)   Wt (!) 160.6 kg   LMP 09/19/2018   SpO2 100%   BMI 55.44 kg/m  EFM: 120-125, moderate variability, pos accels, no decels, reactive TOCO: difficult to trace currently but appears to be q2-3 min  CVE: Dilation: 4 Effacement (%): 50 Cervical Position: Posterior Station: -2 Presentation: Vertex Exam by:: Dr. [email or web address]   A&P: 29 y.o. G1P0 [redacted]w[redacted]d here for IOL for GDMA2 and cHTN which is now with superimposed Severe-Pre E.  #Labor: S/p Cytotec x4 and Foley bulb. Pitocin started at 1700 yesterday. Discontinued at 2100 and gave 3rd and 4th dose of Cytotec. Pit restarted at 0545. AROM at this exam for moderate amount of clear fluid. Reassess in 4 hours.  #FWB: Cat I #GBS positive; Ancef #GDMA2: last glucose 100 #cHTN with superimposed severe Pre-E: BP's now well-controlled on Labetalol 600 mg BID and has not required correction since initial presentation. Pr/Cr 5.94, previously 0.10. CBC and CMP WNL on admission and repeat this afternoon. Continue Magnesium and Procardia protocol. Intermittent headache relieved with Fioricet; difficult to assess whether due to Pre-E, Mag or COVID. (Held evening dose of Labetalol due to low-normal BP's; can give later PRN). #COVID: Continues to deny COVID symptoms. Vitals stable on room air. CXR negative for acute process 1/16.  2/16, MD 10:00 AM

## 2019-06-22 NOTE — Progress Notes (Signed)
Labor Progress Note Tricia Ray is a 29 y.o. G1P0 at [redacted]w[redacted]d presented for IOL for cHTN and GDMA2. S: Feeling some cramps but not much else   O:  BP 129/66   Pulse 84   Temp 98.1 F (36.7 C) (Axillary)   Resp 18   Ht 5\' 7"  (1.702 m)   Wt (!) 160.6 kg   LMP 09/19/2018   SpO2 100%   BMI 55.44 kg/m  EFM: 125, moderate variability, pos accel, no decels, reactive TOCO: q2-3 min, MVU's 160-200  CVE: Dilation: 4 Effacement (%): 70, 80 Cervical Position: Posterior Station: -2 Presentation: Vertex Exam by:: Dr. [email or web address]   A&P: 29 y.o. G1P0 [redacted]w[redacted]d here for IOL for GDMA2 and cHTN which is now with superimposed Severe-Pre E.  #Labor: S/p Cytotec x4 and Foley bulb. Pitocin started at 1700 yesterday. Discontinued at 2100 and gave 3rd and 4th dose of Cytotec. Pit restarted at 0545. Some noticeable effacement of her cervix on this exam, IUPC also placed which shows good contraction pattern and getting close to adequate MVU's. Continue to uptitrate pitocin, recheck SVE once have had period of adequate contractions.   #FWB: Cat I #GBS positive; Ancef #GDMA2: last glucose 81 #cHTN with superimposed severe Pre-E: On Labetalol 600 mg BID and has not required correction since initial presentation. Pr/Cr 5.94, previously 0.10. CBC and CMP WNL on admission and repeat checks. Continue Magnesium and Procardia protocol. Intermittent headache relieved with Fioricet; difficult to assess whether due to Pre-E, Mag or COVID.  Since this AM with very labile BP's, initially held AM labetalol but given around 1400 due to BP creeping back up, ctm closely.  #COVID: Continues to deny COVID symptoms. Vitals stable on room air. CXR negative for acute process 1/16.  2/16, MD 3:14 PM

## 2019-06-22 NOTE — Progress Notes (Signed)
Labor Progress Note Tricia Ray is a 29 y.o. G1P0 at [redacted]w[redacted]d presented for IOL for cHTN and GDMA2. S: Not feeling much  O:  BP (!) 97/53   Pulse 82   Temp 98.3 F (36.8 C) (Oral)   Resp 18   Ht 5\' 7"  (1.702 m)   Wt (!) 160.6 kg   LMP 09/19/2018   SpO2 100%   BMI 55.44 kg/m  EFM: 125, moderate variability, pos accel, no decels, reactive TOCO: q2-3 min, MVU's 210-240  CVE: Dilation: 4 Effacement (%): 90 Cervical Position: Posterior Station: -2 Presentation: Vertex Exam by:: Dr. [email or web address]   A&P: 29 y.o. G1P0 [redacted]w[redacted]d here for IOL for GDMA2 and cHTN which is now with superimposed Severe-Pre E.  #Labor: S/p Cytotec x4 and Foley bulb. Pit since 0545 and AROM at 1000. Has made minimal but noticeable change over the day. Cont pitocin.   #FWB: Cat I #GBS positive; Ancef #GDMA2: last glucose 94 #cHTN with superimposed severe Pre-E: On Labetalol 600 mg BID and has not required correction since initial presentation. Pr/Cr 5.94, previously 0.10. CBC and CMP WNL on admission and repeat checks. Continue Magnesium and Procardia protocol. Intermittent headache relieved with Fioricet; difficult to assess whether due to Pre-E, Mag or COVID.  Since this AM with very labile BP's, initially held AM labetalol but given around 1400 due to BP creeping back up, ctm closely.  #COVID: Continues to deny COVID symptoms. Vitals stable on room air. CXR negative for acute process 1/16.  2/16, MD 8:18 PM

## 2019-06-23 ENCOUNTER — Encounter (HOSPITAL_COMMUNITY): Payer: Self-pay | Admitting: Family Medicine

## 2019-06-23 ENCOUNTER — Encounter (HOSPITAL_COMMUNITY)
Admission: AD | Disposition: A | Discharge status: Home / Self Care | Payer: Self-pay | Source: Home / Self Care | Attending: Obstetrics and Gynecology

## 2019-06-23 DIAGNOSIS — O99824 Streptococcus B carrier state complicating childbirth: Secondary | ICD-10-CM

## 2019-06-23 DIAGNOSIS — Z3A37 37 weeks gestation of pregnancy: Secondary | ICD-10-CM

## 2019-06-23 DIAGNOSIS — O24424 Gestational diabetes mellitus in childbirth, insulin controlled: Secondary | ICD-10-CM

## 2019-06-23 DIAGNOSIS — O134 Gestational [pregnancy-induced] hypertension without significant proteinuria, complicating childbirth: Secondary | ICD-10-CM

## 2019-06-23 LAB — COMPREHENSIVE METABOLIC PANEL
ALT: 11 U/L (ref 0–44)
AST: 18 U/L (ref 15–41)
Albumin: 2 g/dL — ABNORMAL LOW (ref 3.5–5.0)
Alkaline Phosphatase: 152 U/L — ABNORMAL HIGH (ref 38–126)
Anion gap: 9 (ref 5–15)
BUN: <5 mg/dL — ABNORMAL LOW (ref 6–20)
CO2: 23 mmol/L (ref 22–32)
Calcium: 7.7 mg/dL — ABNORMAL LOW (ref 8.9–10.3)
Chloride: 106 mmol/L (ref 98–111)
Creatinine, Ser: 0.83 mg/dL (ref 0.44–1.00)
GFR calc Af Amer: >60 mL/min (ref >60)
GFR calc non Af Amer: >60 mL/min (ref >60)
Glucose, Bld: 102 mg/dL — ABNORMAL HIGH (ref 70–99)
Potassium: 4.1 mmol/L (ref 3.5–5.1)
Sodium: 138 mmol/L (ref 135–145)
Total Bilirubin: 0.6 mg/dL (ref 0.3–1.2)
Total Protein: 5.1 g/dL — ABNORMAL LOW (ref 6.5–8.1)

## 2019-06-23 LAB — GLUCOSE, CAPILLARY
Glucose-Capillary: 95 mg/dL (ref 70–99)
Glucose-Capillary: 102 mg/dL — ABNORMAL HIGH (ref 70–99)

## 2019-06-23 LAB — CBC
HCT: 33.5 % — ABNORMAL LOW (ref 36.0–46.0)
Hemoglobin: 10.5 g/dL — ABNORMAL LOW (ref 12.0–15.0)
MCH: 26.5 pg (ref 26.0–34.0)
MCHC: 31.3 g/dL (ref 30.0–36.0)
MCV: 84.6 fL (ref 80.0–100.0)
Platelets: 226 10*3/uL (ref 150–400)
RBC: 3.96 MIL/uL (ref 3.87–5.11)
RDW: 16.3 % — ABNORMAL HIGH (ref 11.5–15.5)
WBC: 11.5 10*3/uL — ABNORMAL HIGH (ref 4.0–10.5)
nRBC: 0 % (ref 0.0–0.2)

## 2019-06-23 SURGERY — Surgical Case
Anesthesia: Epidural | Wound class: Clean Contaminated

## 2019-06-23 MED ORDER — SODIUM CHLORIDE 0.9 % IR SOLN
Status: DC | PRN
  Administered 2019-06-23: 1000 mL

## 2019-06-23 MED ORDER — SIMETHICONE 80 MG PO CHEW
80.0000 mg | CHEWABLE_TABLET | ORAL | Status: DC
  Administered 2019-06-24 (×2): 80 mg via ORAL
  Filled 2019-06-23 (×2): qty 1

## 2019-06-23 MED ORDER — MAGNESIUM SULFATE 40 GM/1000ML IV SOLN
2.0000 g/h | INTRAVENOUS | Status: AC
  Administered 2019-06-23: 2 g/h via INTRAVENOUS
  Filled 2019-06-23: qty 1000

## 2019-06-23 MED ORDER — NALBUPHINE HCL 10 MG/ML IJ SOLN: 5.0000 mg | Freq: Once | INTRAMUSCULAR | Status: DC | PRN

## 2019-06-23 MED ORDER — PRENATAL MULTIVITAMIN CH
1.0000 | ORAL_TABLET | Freq: Every day | ORAL | Status: DC
  Administered 2019-06-23 – 2019-06-25 (×3): 1 via ORAL
  Filled 2019-06-23 (×3): qty 1

## 2019-06-23 MED ORDER — MORPHINE SULFATE (PF) 0.5 MG/ML IJ SOLN
INTRAMUSCULAR | Status: DC | PRN
  Administered 2019-06-23: 3 mg via EPIDURAL

## 2019-06-23 MED ORDER — NALOXONE HCL 4 MG/10ML IJ SOLN
1.0000 ug/kg/h | INTRAVENOUS | Status: DC | PRN
  Filled 2019-06-23: qty 5

## 2019-06-23 MED ORDER — SIMETHICONE 80 MG PO CHEW: 80.0000 mg | CHEWABLE_TABLET | ORAL | Status: DC | PRN

## 2019-06-23 MED ORDER — ENOXAPARIN SODIUM 80 MG/0.8ML ~~LOC~~ SOLN
80.0000 mg | SUBCUTANEOUS | Status: DC
  Administered 2019-06-24 – 2019-06-25 (×2): 80 mg via SUBCUTANEOUS
  Filled 2019-06-23 (×2): qty 0.8

## 2019-06-23 MED ORDER — OXYCODONE HCL 5 MG PO TABS
5.0000 mg | ORAL_TABLET | ORAL | Status: DC | PRN
  Administered 2019-06-24 (×4): 10 mg via ORAL
  Administered 2019-06-25: 5 mg via ORAL
  Administered 2019-06-25 (×2): 10 mg via ORAL
  Filled 2019-06-23: qty 1
  Filled 2019-06-23 (×7): qty 2

## 2019-06-23 MED ORDER — MORPHINE SULFATE (PF) 0.5 MG/ML IJ SOLN
INTRAMUSCULAR | Status: AC
  Filled 2019-06-23: qty 10

## 2019-06-23 MED ORDER — TRANEXAMIC ACID-NACL 1000-0.7 MG/100ML-% IV SOLN
INTRAVENOUS | Status: AC
  Filled 2019-06-23: qty 100

## 2019-06-23 MED ORDER — DEXTROSE 5 % IV SOLN
INTRAVENOUS | Status: DC | PRN
  Administered 2019-06-23: 3 g via INTRAVENOUS

## 2019-06-23 MED ORDER — FENTANYL CITRATE (PF) 100 MCG/2ML IJ SOLN
INTRAMUSCULAR | Status: AC
  Filled 2019-06-23: qty 2

## 2019-06-23 MED ORDER — LACTATED RINGERS IV SOLN: INTRAVENOUS | Status: DC

## 2019-06-23 MED ORDER — PROMETHAZINE HCL 25 MG/ML IJ SOLN: 6.2500 mg | INTRAMUSCULAR | Status: DC | PRN

## 2019-06-23 MED ORDER — DIBUCAINE (PERIANAL) 1 % EX OINT: 1.0000 "application " | TOPICAL_OINTMENT | CUTANEOUS | Status: DC | PRN

## 2019-06-23 MED ORDER — ACETAMINOPHEN 10 MG/ML IV SOLN: 1000.0000 mg | Freq: Once | INTRAVENOUS | Status: DC | PRN

## 2019-06-23 MED ORDER — WITCH HAZEL-GLYCERIN EX PADS: 1.0000 "application " | MEDICATED_PAD | CUTANEOUS | Status: DC | PRN

## 2019-06-23 MED ORDER — SIMETHICONE 80 MG PO CHEW
80.0000 mg | CHEWABLE_TABLET | Freq: Three times a day (TID) | ORAL | Status: DC
  Administered 2019-06-23 – 2019-06-25 (×6): 80 mg via ORAL
  Filled 2019-06-23 (×6): qty 1

## 2019-06-23 MED ORDER — ONDANSETRON HCL 4 MG/2ML IJ SOLN
INTRAMUSCULAR | Status: AC
  Filled 2019-06-23: qty 2

## 2019-06-23 MED ORDER — MEPERIDINE HCL 25 MG/ML IJ SOLN: 6.2500 mg | INTRAMUSCULAR | Status: DC | PRN

## 2019-06-23 MED ORDER — COCONUT OIL OIL: 1.0000 "application " | TOPICAL_OIL | Status: DC | PRN

## 2019-06-23 MED ORDER — NALBUPHINE HCL 10 MG/ML IJ SOLN: 5.0000 mg | INTRAMUSCULAR | Status: DC | PRN

## 2019-06-23 MED ORDER — OXYTOCIN 40 UNITS IN NORMAL SALINE INFUSION - SIMPLE MED: 2.5000 [IU]/h | INTRAVENOUS | Status: AC

## 2019-06-23 MED ORDER — MENTHOL 3 MG MT LOZG: 1.0000 | LOZENGE | OROMUCOSAL | Status: DC | PRN

## 2019-06-23 MED ORDER — HYDROMORPHONE HCL 1 MG/ML IJ SOLN
INTRAMUSCULAR | Status: AC
  Filled 2019-06-23: qty 1

## 2019-06-23 MED ORDER — FENTANYL CITRATE (PF) 100 MCG/2ML IJ SOLN
INTRAMUSCULAR | Status: DC | PRN
  Administered 2019-06-23: 100 ug via INTRAVENOUS

## 2019-06-23 MED ORDER — ACETAMINOPHEN 500 MG PO TABS
1000.0000 mg | ORAL_TABLET | Freq: Four times a day (QID) | ORAL | Status: AC
  Administered 2019-06-23 – 2019-06-24 (×3): 1000 mg via ORAL
  Filled 2019-06-23 (×3): qty 2

## 2019-06-23 MED ORDER — SENNOSIDES-DOCUSATE SODIUM 8.6-50 MG PO TABS
2.0000 | ORAL_TABLET | ORAL | Status: DC
  Administered 2019-06-24 (×2): 2 via ORAL
  Filled 2019-06-23 (×2): qty 2

## 2019-06-23 MED ORDER — ACETAMINOPHEN 325 MG PO TABS
650.0000 mg | ORAL_TABLET | ORAL | Status: DC | PRN
  Administered 2019-06-24 – 2019-06-25 (×3): 650 mg via ORAL
  Filled 2019-06-23 (×3): qty 2

## 2019-06-23 MED ORDER — TETANUS-DIPHTH-ACELL PERTUSSIS 5-2.5-18.5 LF-MCG/0.5 IM SUSP: 0.5000 mL | Freq: Once | INTRAMUSCULAR | Status: DC

## 2019-06-23 MED ORDER — BUPIVACAINE HCL (PF) 0.5 % IJ SOLN
INTRAMUSCULAR | Status: AC
  Filled 2019-06-23: qty 30

## 2019-06-23 MED ORDER — LIDOCAINE-EPINEPHRINE (PF) 2 %-1:200000 IJ SOLN
INTRAMUSCULAR | Status: DC | PRN
  Administered 2019-06-23 (×2): 5 mg via INTRADERMAL

## 2019-06-23 MED ORDER — SODIUM CHLORIDE 0.9% FLUSH: 3.0000 mL | INTRAVENOUS | Status: DC | PRN

## 2019-06-23 MED ORDER — DIPHENHYDRAMINE HCL 50 MG/ML IJ SOLN: 12.5000 mg | INTRAMUSCULAR | Status: DC | PRN

## 2019-06-23 MED ORDER — HYDROMORPHONE HCL 1 MG/ML IJ SOLN
0.2500 mg | INTRAMUSCULAR | Status: DC | PRN
  Administered 2019-06-23: 0.5 mg via INTRAVENOUS

## 2019-06-23 MED ORDER — ONDANSETRON HCL 4 MG/2ML IJ SOLN: 4.0000 mg | Freq: Three times a day (TID) | INTRAMUSCULAR | Status: DC | PRN

## 2019-06-23 MED ORDER — PHENYLEPHRINE 40 MCG/ML (10ML) SYRINGE FOR IV PUSH (FOR BLOOD PRESSURE SUPPORT)
PREFILLED_SYRINGE | INTRAVENOUS | Status: AC
  Filled 2019-06-23: qty 10

## 2019-06-23 MED ORDER — AMLODIPINE BESYLATE 10 MG PO TABS
10.0000 mg | ORAL_TABLET | Freq: Every day | ORAL | Status: DC
  Administered 2019-06-23 – 2019-06-25 (×3): 10 mg via ORAL
  Filled 2019-06-23 (×3): qty 1

## 2019-06-23 MED ORDER — ZOLPIDEM TARTRATE 5 MG PO TABS: 5.0000 mg | ORAL_TABLET | Freq: Every evening | ORAL | Status: DC | PRN

## 2019-06-23 MED ORDER — TRANEXAMIC ACID-NACL 1000-0.7 MG/100ML-% IV SOLN
INTRAVENOUS | Status: DC | PRN
  Administered 2019-06-23: 1000 mg via INTRAVENOUS

## 2019-06-23 MED ORDER — SCOPOLAMINE 1 MG/3DAYS TD PT72: 1.0000 | MEDICATED_PATCH | Freq: Once | TRANSDERMAL | Status: DC

## 2019-06-23 MED ORDER — NALOXONE HCL 0.4 MG/ML IJ SOLN: 0.4000 mg | INTRAMUSCULAR | Status: DC | PRN

## 2019-06-23 MED ORDER — PHENYLEPHRINE HCL (PRESSORS) 10 MG/ML IV SOLN
INTRAVENOUS | Status: DC | PRN
  Administered 2019-06-23 (×3): 80 ug via INTRAVENOUS

## 2019-06-23 MED ORDER — LACTATED RINGERS IV SOLN: INTRAVENOUS | Status: DC | PRN

## 2019-06-23 MED ORDER — DEXTROSE 5 % IV SOLN
INTRAVENOUS | Status: AC
  Filled 2019-06-23: qty 3000

## 2019-06-23 MED ORDER — DIPHENHYDRAMINE HCL 25 MG PO CAPS: 25.0000 mg | ORAL_CAPSULE | Freq: Four times a day (QID) | ORAL | Status: DC | PRN

## 2019-06-23 MED ORDER — DIPHENHYDRAMINE HCL 25 MG PO CAPS: 25.0000 mg | ORAL_CAPSULE | ORAL | Status: DC | PRN

## 2019-06-23 MED ORDER — ONDANSETRON HCL 4 MG/2ML IJ SOLN
INTRAMUSCULAR | Status: DC | PRN
  Administered 2019-06-23: 4 mg via INTRAVENOUS

## 2019-06-23 SURGICAL SUPPLY — 38 items
BARRIER ADHS 3X4 INTERCEED (GAUZE/BANDAGES/DRESSINGS) IMPLANT
CHLORAPREP W/TINT 26ML (MISCELLANEOUS) ×3 IMPLANT
CLAMP CORD UMBIL (MISCELLANEOUS) IMPLANT
CLOTH BEACON ORANGE TIMEOUT ST (SAFETY) ×3 IMPLANT
DRSG OPSITE POSTOP 4X10 (GAUZE/BANDAGES/DRESSINGS) ×3 IMPLANT
ELECT REM PT RETURN 9FT ADLT (ELECTROSURGICAL) ×3
ELECTRODE REM PT RTRN 9FT ADLT (ELECTROSURGICAL) ×1 IMPLANT
EXTRACTOR VACUUM KIWI (MISCELLANEOUS) IMPLANT
GLOVE BIO SURGEON STRL SZ 6.5 (GLOVE) ×2 IMPLANT
GLOVE BIO SURGEONS STRL SZ 6.5 (GLOVE) ×1
GLOVE BIOGEL PI IND STRL 7.0 (GLOVE) ×2 IMPLANT
GLOVE BIOGEL PI INDICATOR 7.0 (GLOVE) ×4
GOWN STRL REUS W/TWL LRG LVL3 (GOWN DISPOSABLE) ×6 IMPLANT
HOVERMATT SINGLE USE (MISCELLANEOUS) ×3 IMPLANT
KIT ABG SYR 3ML LUER SLIP (SYRINGE) IMPLANT
NEEDLE HYPO 22GX1.5 SAFETY (NEEDLE) IMPLANT
NEEDLE HYPO 25X5/8 SAFETYGLIDE (NEEDLE) IMPLANT
NS IRRIG 1000ML POUR BTL (IV SOLUTION) ×3 IMPLANT
PACK C SECTION WH (CUSTOM PROCEDURE TRAY) ×3 IMPLANT
PAD ABD 8X10 STRL (GAUZE/BANDAGES/DRESSINGS) ×3 IMPLANT
PAD OB MATERNITY 4.3X12.25 (PERSONAL CARE ITEMS) ×3 IMPLANT
PENCIL SMOKE EVAC W/HOLSTER (ELECTROSURGICAL) ×3 IMPLANT
RETRACTOR TRAXI PANNICULUS (MISCELLANEOUS) ×1 IMPLANT
RETRACTOR WND ALEXIS 25 LRG (MISCELLANEOUS) IMPLANT
RTRCTR WOUND ALEXIS 25CM LRG (MISCELLANEOUS)
SPONGE GAUZE 4X4 12PLY STER LF (GAUZE/BANDAGES/DRESSINGS) ×6 IMPLANT
SUT PLAIN 2 0 XLH (SUTURE) ×3 IMPLANT
SUT VIC AB 0 CT1 36 (SUTURE) ×18 IMPLANT
SUT VIC AB 2-0 CT1 27 (SUTURE) ×2
SUT VIC AB 2-0 CT1 TAPERPNT 27 (SUTURE) ×1 IMPLANT
SUT VIC AB 4-0 KS 27 (SUTURE) ×3 IMPLANT
SUT VIC AB 4-0 PS2 27 (SUTURE) ×3 IMPLANT
SYR CONTROL 10ML LL (SYRINGE) IMPLANT
TAPE CLOTH SURG 4X10 WHT LF (GAUZE/BANDAGES/DRESSINGS) ×3 IMPLANT
TOWEL OR 17X24 6PK STRL BLUE (TOWEL DISPOSABLE) ×3 IMPLANT
TRAXI PANNICULUS RETRACTOR (MISCELLANEOUS) ×2
TRAY FOLEY W/BAG SLVR 14FR LF (SET/KITS/TRAYS/PACK) IMPLANT
WATER STERILE IRR 1000ML POUR (IV SOLUTION) ×3 IMPLANT

## 2019-06-23 NOTE — Op Note (Addendum)
Tricia Ray PROCEDURE DATE: 06/23/2019  PREOPERATIVE DIAGNOSES: Intrauterine pregnancy at [redacted]w[redacted]d weeks gestation; failure to progress: arrest of descent and failure to progress: arrest of dilation  POSTOPERATIVE DIAGNOSES: The same  PROCEDURE: Primary Low Transverse Cesarean Section  SURGEON:  Dr. Emeterio Reeve - Primary Barrington Ellison, MD - Fellow  ANESTHESIOLOGY TEAM: Anesthesiologist: Lyn Hollingshead, MD; Brennan Bailey, MD; Lidia Collum, MD CRNA: Genevie Ann, CRNA  INDICATIONS: Tricia Ray is a 29 y.o. G1P1001 at [redacted]w[redacted]d here for cesarean section secondary to the indications listed under preoperative diagnoses; please see preoperative note for further details.  The risks of cesarean section were discussed with the patient including but were not limited to: bleeding which may require transfusion or reoperation; infection which may require antibiotics; injury to bowel, bladder, ureters or other surrounding organs; injury to the fetus; need for additional procedures including hysterectomy in the event of a life-threatening hemorrhage; placental abnormalities wth subsequent pregnancies, incisional problems, thromboembolic phenomenon and other postoperative/anesthesia complications.   The patient concurred with the proposed plan, giving informed written consent for the procedure.    FINDINGS:  Viable female infant in cephalic OP presentation.  Clear amniotic fluid.  Intact placenta, three vessel cord.  Normal uterus, fallopian tubes and ovaries bilaterally. APGAR (1 MIN): 6   APGAR (5 MINS): 9  APGAR (10 MINS):    ANESTHESIA: Epidural  INTRAVENOUS FLUIDS: 1060 ml   ESTIMATED BLOOD LOSS: 602 ml URINE OUTPUT:  50 ml SPECIMENS: Placenta sent to L&D COMPLICATIONS: None immediate  PROCEDURE IN DETAIL:  The patient preoperatively received intravenous antibiotics and had sequential compression devices applied to her lower extremities.  She was then taken to the operating room  where the epidural anesthesia was dosed up to surgical level and was found to be adequate. She was then placed in a dorsal supine position with a leftward tilt, and prepped and draped in a sterile manner.  A foley catheter had been previously placed into her bladder and attached to constant gravity.  After an adequate timeout was performed, a Pfannenstiel skin incision was made with scalpel and carried through to the underlying layer of fascia. The fascia was incised in the midline, and this incision was extended bilaterally using blunt traction.  Kocher clamps were applied to the superior aspect of the fascial incision and the underlying rectus muscles were dissected off bluntly.  A similar process was carried out on the inferior aspect of the fascial incision. The rectus muscles were separated in the midline and the peritoneum was entered bluntly. The Alexis self-retaining retractor was introduced into the abdominal cavity.  Attention was turned to the lower uterine segment where a bladder flap and then a  low transverse hysterotomy was made with a scalpel and extended bilaterally bluntly.  The infant was successfully delivered, the cord was clamped and cut prior to one minute due to tone/color and the infant was handed over to the awaiting neonatology team. Uterine massage was then administered, and the placenta delivered intact with a three-vessel cord. The uterus was then cleared of clots and debris.  The hysterotomy was closed with 0 Vicryl in a running locked fashion, and an imbricating layer was also placed with 0 Vicryl.  Figure-of-eight 0 Vicryl serosal stitches were placed to help with hemostasis.  The pelvis was cleared of all clot and debris. Hemostasis was confirmed on all surfaces.  The retractor was removed.  The peritoneum was closed with a 0 Vicryl running stitch. The fascia was then closed using  0 Vicryl in a running fashion.  The subcutaneous layer was irrigated, reapproximated with 2-0 plain gut  interrupted stitches, and the skin was closed with a 4-0 Vicryl subcuticular stitch. The patient tolerated the procedure well. Sponge, instrument and needle counts were correct x 3.  She was taken to the recovery room in stable condition.   Jerilynn Birkenhead, MD [facility] Family Medicine Fellow, [facility] for [employer], [facility]

## 2019-06-23 NOTE — Discharge Summary (Signed)
Postpartum Discharge Summary    Patient Name: Tricia Ray DOB: 1990-11-17 MRN: [id number]  Date of admission: 06/21/2019 Delivering Provider: Wood[place] Mode   Date of discharge: 06/25/2019  Admitting diagnosis: Diabetes in pregnancy [O24.919] Intrauterine pregnancy: [redacted]w[redacted]d    Secondary diagnosis:  Active Problems:   GDM (gestational diabetes mellitus)   Asthma   Obesity affecting pregnancy   BMI 50.0-59.9, adult ([place]   LGA (large for gestational age) fetus affecting management of mother   Chronic hypertension   COVID-19 virus infection   Severe pre-eclampsia  Additional problems: None     Discharge diagnosis: Term Pregnancy Delivered, CHTN with superimposed preeclampsia and GDM A2                                                                                                Post partum procedures:Magnesium  Augmentation: AROM, Pitocin, Cytotec and Foley Balloon  Complications: None  Hospital course:  Induction of Labor With Cesarean Section  29y.o. yo G1P1001 at 30w2das admitted to the hospital 06/21/2019 for induction of labor. Patient had a labor course significant for the following. IOL for cHTN and GDBig SpringFound to have severe Pre-E. Mag and Labetalol protocol initiated. Patient received Cytotec, FB, Pitocin and AROM. Received epidural. Remained at 4 cm for > 24 hours. The patient went for cesarean section due to Arrest of Dilation and Arrest of Descent, and delivered a Viable infant,06/23/2019  Membrane Rupture Time/Date: 9:56 AM ,06/22/2019   Details of operation can be found in separate operative Note. Magnesium continued for 24 hours post-partum. Norvasc 10 mg initiated, then HCTZ 25 and sent home with this plus Labetalol at patient request, she strongly desired discharge. She is s/p Magnesium x 24 hours. Opted for oral birth control POPs. She is ambulating, tolerating a regular diet, passing flatus, and urinating well.  Patient is discharged home in stable condition on  06/25/19.                                   Delivery time: 5:14 AM    Magnesium Sulfate received: Yes BMZ received: No Rhophylac:No MMR:No Transfusion:No  Physical exam  Vitals:   06/25/19 1056 06/25/19 1115 06/25/19 1124 06/25/19 1201  BP: (!) 159/83 (!) 167/86 (!) 151/64 (!) 133/59  Pulse: 92 (!) 101 94 93  Resp:      Temp:      TempSrc:      SpO2:      Weight:      Height:       General: alert, cooperative and no distress Lochia: appropriate Uterine Fundus: firm Incision: Dressing is clean, dry, and intact DVT Evaluation: No evidence of DVT seen on physical exam. Labs: Lab Results  Component Value Date   WBC 10.4 06/24/2019   HGB 9.8 (L) 06/24/2019   HCT 31.1 (L) 06/24/2019   MCV 85.4 06/24/2019   PLT 219 06/24/2019   CMP Latest Ref Rng & Units 06/23/2019  Glucose 70 - 99 mg/dL 102(H)  BUN 6 - 20 mg/dL <  5(L)  Creatinine 0.44 - 1.00 mg/dL 0.83  Sodium 135 - 145 mmol/L 138  Potassium 3.5 - 5.1 mmol/L 4.1  Chloride 98 - 111 mmol/L 106  CO2 22 - 32 mmol/L 23  Calcium 8.9 - 10.3 mg/dL 7.7(L)  Total Protein 6.5 - 8.1 g/dL 5.1(L)  Total Bilirubin 0.3 - 1.2 mg/dL 0.6  Alkaline Phos 38 - 126 U/L 152(H)  AST 15 - 41 U/L 18  ALT 0 - 44 U/L 11    Discharge instruction: per After Visit Summary and "Baby and Me Booklet".  After visit meds:  Allergies as of 06/25/2019      Reactions   Shrimp [shellfish Allergy] Anaphylaxis   Itching and swelling in mounth   Aleve [naproxen Sodium] Hives, Swelling   Cherry Hives, Itching, Swelling   Penicillins Other (See Comments)   Causes pink eye, blurry vision Did it involve swelling of the face/tongue/throat, SOB, or low BP? No Did it involve sudden or severe rash/hives, skin peeling, or any reaction on the inside of your mouth or nose? No Did you need to seek medical attention at a hospital or doctor's office? No When did it last happen?2018 If all above answers are "NO", may proceed with cephalosporin use.       Medication List    STOP taking these medications   insulin aspart 100 UNIT/ML injection Commonly known as: novoLOG   insulin glargine 100 UNIT/ML injection Commonly known as: Lantus     TAKE these medications   Accu-Chek Guide test strip Generic drug: glucose blood Use as instructed   Accu-Chek Softclix Lancets lancets Use as instructed   amLODipine 10 MG tablet Commonly known as: NORVASC Take 1 tablet (10 mg total) by mouth daily. Start taking on: June 26, 2019   aspirin EC 81 MG tablet Take 1 tablet (81 mg total) by mouth daily. Take after 12 weeks for prevention of preeclampsia later in pregnancy   ferrous sulfate 325 (65 FE) MG tablet Take 1 tablet (325 mg total) by mouth daily with breakfast.   hydrochlorothiazide 25 MG tablet Commonly known as: HYDRODIURIL Take 1 tablet (25 mg total) by mouth daily. Start taking on: June 26, 2019   INSULIN SYRINGE 1CC/31GX5/16" 31G X 5/16" 1 ML Misc 1 Device by Does not apply route 4 (four) times daily.   labetalol 200 MG tablet Commonly known as: NORMODYNE Take 3 tablets (600 mg total) by mouth 2 (two) times daily.   metFORMIN 500 MG tablet Commonly known as: GLUCOPHAGE Take 1 tablet at bedtime nightly   norethindrone 0.35 MG tablet Commonly known as: MICRONOR Take 1 tablet (0.35 mg total) by mouth daily. Begin 4-6 weeks after birth   oxyCODONE-acetaminophen 5-325 MG tablet Commonly known as: PERCOCET/ROXICET Take 1-2 tablets by mouth every 6 (six) hours as needed.   prenatal multivitamin Tabs tablet Take 1 tablet by mouth daily at 12 noon.   Vitamin D (Ergocalciferol) 1.25 MG (50000 UNIT) Caps capsule Commonly known as: DRISDOL Take 1 capsule (50,000 Units total) by mouth every 7 (seven) days.       Diet: routine diet  Activity: Advance as tolerated. Pelvic rest for 6 weeks.   Outpatient follow up:4 weeks Follow up Appt: Future Appointments  Date Time Provider [place]  07/08/2019  2:00 PM  [place]  07/31/2019  1:55 PM Rasch, Artist Pais, NP [facility]  08/05/2019  8:20 AM [facility] LAB [facility]   Follow up Visit: [place] for  [place] Follow up.   Specialty: Obstetrics and Gynecology Contact information: [address] [place], [place] [id number] [place] [id number] [phone]           Please schedule this patient for Postpartum visit in: 4 weeks with the following provider: MD For C/S patients schedule nurse incision check in weeks 2 weeks: yes High risk pregnancy complicated by: HTN Delivery mode:  CS Anticipated Birth Control:  POPs PP Procedures needed: BP, GTT and incision check  Schedule Integrated BH visit: no      Newborn Data: Live born female  Birth Weight: 4085 gms   APGAR (1 MIN): 6   APGAR (5 MINS): 9      Newborn Delivery   Birth date/time: 06/23/2019 05:14:00 Delivery type: C-Section, Low Transverse Trial of labor: Yes C-section categorization: Primary      Baby Feeding: Bottle and Breast Disposition:home with mother   06/25/2019 Donnamae Jude, MD

## 2019-06-23 NOTE — Progress Notes (Signed)
Labor Progress Note Tricia Ray is a 29 y.o. G1P0 at [redacted]w[redacted]d presented for IOL for cHTN and GDMA2. S: Thought she was feeling pressure.  O:  BP (!) 152/51   Pulse 85   Temp 98.2 F (36.8 C) (Axillary)   Resp 18   Ht 5\' 7"  (1.702 m)   Wt (!) 160.6 kg   LMP 09/19/2018   SpO2 100%   BMI 55.44 kg/m  EFM: 125, moderate variability, pos accel, no decels, reactive TOCO: q2-3 min, MVU's 210-240  CVE: Dilation: 4.5 Effacement (%): 90 Cervical Position: Posterior Station: -2 Presentation: Vertex Exam by:: K.Cowher RN   A&P: 29 y.o. G1P0 [redacted]w[redacted]d here for IOL for GDMA2 and cHTN which is now with superimposed Severe-Pre E.  #Labor: S/p Cytotec x4 and Foley bulb. Pit since 0545 and AROM at 1000. No cervical change since 1930. Pit at 30. MVUs were adequate and now downtrending. Patient starting to get frustrated. Will d/w Dr. 1931. Cont pitocin.   #FWB: Cat I #GBS positive; Ancef #GDMA2: last glucose 112 #cHTN with superimposed severe Pre-E: On Labetalol 600 mg BID and has not required correction since initial presentation. Pr/Cr 5.94, previously 0.10. CBC and CMP WNL on admission and repeat checks. Continue Magnesium and Procardia protocol. Intermittent headache relieved with Fioricet; difficult to assess whether due to Pre-E, Mag or COVID.  Since this AM with very labile BP's, initially held AM labetalol but given around 1400 due to BP creeping back up, ctm closely.  #COVID: Continues to deny COVID symptoms. Vitals stable on room air. CXR negative for acute process 1/16.  2/16, MD 2:43 AM

## 2019-06-23 NOTE — Lactation Note (Signed)
This note was copied from a baby's chart. Lactation Consultation Note  Patient Name: Tricia Ray [id number] Date: 06/23/2019 Reason for consult: Initial assessment;Early term 37-38.6wks;Primapara;1st time breastfeeding  P1 mother whose infant is now 56 hours old.  This is an ETI at 37+2 weeks weighing >9 lbs.  Mother had gestational diabetes and is Covid +.  She did not realize she was Covid + until admission and is currently asymptomatic.  Mother was holding baby when I arrived.  She informed me that she did not know if she even wanted to breast feed, however, baby was able to latch right after delivery.  Mother plans to breast feed for 4-6 weeks and then return to work.  She will provide formula when she returns to work.  Basic education provided including reviewing the "Caring For Your Late Preterm Baby" handout.  Discussed STS, tummy size, supply and demand, hand expression and breast massage, feeding and pumping frequency and calling for latch assistance as needed.  Encouraged to feed 8-12 times/24 hours or sooner if baby shows feeding cues.  Reviewed cues.  Mother is familiar with hand expression.  Colostrum container provided and milk storage times discussed.  Finger feeding demonstrated.  Mother will feed back any EBM she obtains to baby.  Mother is a [facility] participant in [place].  She will be choosing the formula package.  I informed her that she will not be eligible to obtain a DEBP if she chooses the formula package.  Informed mother that we will provide a manual pump at discharge but that will not be adequate to maintain a good milk supply.  Mother verbalized understanding.    Mom made aware of O/P services, breastfeeding support groups, community resources, and our phone # for post-discharge questions. Support person present. RN in room and aware of my visit.   Maternal Data Formula Feeding for Exclusion: Yes Reason for exclusion: Mother's choice to formula and breast feed  on admission Has patient been taught Hand Expression?: Yes Does the patient have breastfeeding experience prior to this delivery?: No  Feeding    LATCH Score                   Interventions    Lactation Tools Discussed/Used WIC Program: Yes Pump Review: Setup, frequency, and cleaning;Milk Storage(Reviewed)   Consult Status Consult Status: Follow-up Date: 06/24/19 Follow-up type: In-patient    Dora Sims 06/23/2019, 3:51 PM

## 2019-06-23 NOTE — Progress Notes (Addendum)
Went bedside with Dr. Debroah Loop to discuss options with patient. She has been 4 cm for 36 hours. While effacement has improved, no improvement since change of shift and now molding present with no improvement in station. MVU's adequate for past 10 hours. Shared-decision making with patient and she would like to proceed with C-section for failure to progress. The risks of cesarean section discussed with the patient included but were not limited to: bleeding which may require transfusion or reoperation; infection which may require antibiotics; injury to bowel, bladder, ureters or other surrounding organs; injury to the fetus; need for additional procedures including hysterectomy in the event of a life-threatening hemorrhage; placental abnormalities wth subsequent pregnancies, incisional problems, thromboembolic phenomenon and other postoperative/anesthesia complications. The patient concurred with the proposed plan, giving informed written consent for the procedure.  Patient has been NPO > 12 hours and she will remain NPO for procedure. Anesthesia and OR aware. Preoperative prophylactic NA as patient has received Ancef since admission for GBS pps.  To OR when ready.  Jerilynn Birkenhead, MD [facility] Fellow, [facility] for [employer], [facility]

## 2019-06-24 LAB — CBC
HCT: 31.1 % — ABNORMAL LOW (ref 36.0–46.0)
Hemoglobin: 9.8 g/dL — ABNORMAL LOW (ref 12.0–15.0)
MCH: 26.9 pg (ref 26.0–34.0)
MCHC: 31.5 g/dL (ref 30.0–36.0)
MCV: 85.4 fL (ref 80.0–100.0)
Platelets: 219 10*3/uL (ref 150–400)
RBC: 3.64 MIL/uL — ABNORMAL LOW (ref 3.87–5.11)
RDW: 16.4 % — ABNORMAL HIGH (ref 11.5–15.5)
WBC: 10.4 10*3/uL (ref 4.0–10.5)
nRBC: 0 % (ref 0.0–0.2)

## 2019-06-24 MED ORDER — LABETALOL HCL 5 MG/ML IV SOLN
20.0000 mg | INTRAVENOUS | Status: DC | PRN
  Administered 2019-06-24 – 2019-06-25 (×3): 20 mg via INTRAVENOUS
  Filled 2019-06-24 (×2): qty 4

## 2019-06-24 MED ORDER — LABETALOL HCL 5 MG/ML IV SOLN
40.0000 mg | INTRAVENOUS | Status: DC | PRN
  Administered 2019-06-24: 40 mg via INTRAVENOUS
  Filled 2019-06-24 (×2): qty 8

## 2019-06-24 MED ORDER — LABETALOL HCL 5 MG/ML IV SOLN: 80.0000 mg | INTRAVENOUS | Status: DC | PRN

## 2019-06-24 MED ORDER — HYDRALAZINE HCL 10 MG PO TABS
10.0000 mg | ORAL_TABLET | Freq: Once | ORAL | Status: AC
  Administered 2019-06-24: 10 mg via ORAL
  Filled 2019-06-24: qty 1

## 2019-06-24 NOTE — Lactation Note (Signed)
This note was copied from a baby's chart. Lactation Consultation Note  Patient Name: Girl Everlina Gotts [id number] Date: 06/24/2019 Reason for consult: Follow-up assessment  LC Follow Up Visit:  Attempted to call into mother's room to speak with her regarding breast feeding.  No one available to answer my call; will speak with RN and have her page me if mother needs latch assistance.  Mother has only attempted breast feeding once since our visit together on 06/23/19.  Her feeding preference on admission was breast/bottle.   Maternal Data    Feeding Feeding Type: Bottle Fed - Formula Nipple Type: Slow - flow  LATCH Score                   Interventions    Lactation Tools Discussed/Used     Consult Status Consult Status: Follow-up Date: 06/25/19 Follow-up type: In-patient    Duana Benedict R Jarold Macomber 06/24/2019, 12:02 PM

## 2019-06-24 NOTE — Progress Notes (Signed)
Subjective: Postpartum Day 1: Cesarean Delivery Patient reports nausea, vomiting, incisional pain, tolerating PO and no problems voiding.    Objective: Vital signs in last 24 hours: Temp:  [97.8 F (36.6 C)-98.7 F (37.1 C)] 98.1 F (36.7 C) (01/19 1145) Pulse Rate:  [77-93] 90 (01/19 1145) Resp:  [17-19] 19 (01/19 0811) BP: (144-194)/(67-107) 158/82 (01/19 1145) SpO2:  [98 %-100 %] 98 % (01/19 1145)  Physical Exam:  General: alert, cooperative and no distress Lochia: appropriate Uterine Fundus: firm Incision: no significant drainage, no dehiscence DVT Evaluation: No evidence of DVT seen on physical exam. Negative Homan's sign. No cords or calf tenderness.  Recent Labs    06/23/19 0901 06/24/19 0602  HGB 10.5* 9.8*  HCT 33.5* 31.1*    Assessment/Plan: Status post Cesarean section. Doing well postoperatively.  Continue current care. No respiratory disease.   Levie Heritage 06/24/2019, 1:54 PM

## 2019-06-24 NOTE — Anesthesia Postprocedure Evaluation (Signed)
Anesthesia Post Note  Patient: Tricia Ray  Procedure(s) Performed: CESAREAN SECTION (N/A )     Patient location during evaluation: PACU Anesthesia Type: Epidural Level of consciousness: awake Pain management: pain level controlled Vital Signs Assessment: post-procedure vital signs reviewed and stable Respiratory status: spontaneous breathing Cardiovascular status: stable Postop Assessment: no headache, no backache, epidural receding, patient able to bend at knees and no apparent nausea or vomiting Anesthetic complications: no    Last Vitals:  Vitals:   06/24/19 0231 06/24/19 0636  BP: (!) 145/82 (!) 146/68  Pulse: 88 93  Resp: 17 19  Temp: 37.1 C 37.1 C  SpO2: 99% 99%    Last Pain:  Vitals:   06/24/19 0636  TempSrc: Oral  PainSc:    Pain Goal: Patients Stated Pain Goal: 4 (06/24/19 0409)                 Caren Macadam

## 2019-06-24 NOTE — Lactation Note (Signed)
This note was copied from a baby's chart. Lactation Consultation Note  Patient Name: Tricia Ray [id number] Date: 06/24/2019 Reason for consult: Follow-up assessment   LC Follow Up Visit:  Attempted to call mother in her patient room, however, she did not answer.  RN paged and message left to call me if mother requires lactation follow up.  She has been formula feeding.   Consult Status Consult Status: Follow-up Date: 06/25/19 Follow-up type: In-patient    Dora Sims 06/24/2019, 5:22 PM

## 2019-06-24 NOTE — Progress Notes (Signed)
CSW received consult for hx of marijuana use.  Referral was screened out due to the following: ~MOB had no documented substance use after initial prenatal visit/+UPT. ~MOB had no positive drug screens after initial prenatal visit/+UPT.  Please consult CSW if current concerns arise or by MOB's request.  CSW will monitor CDS results and make report to Child Protective Services if warranted.  Kason Benak, LCSW Clinical Social Worker [facility] Cell#: [phone] 

## 2019-06-25 ENCOUNTER — Encounter: Payer: Self-pay | Admitting: Anesthesiology

## 2019-06-25 MED ORDER — LABETALOL HCL 5 MG/ML IV SOLN: 40.0000 mg | INTRAVENOUS | Status: DC | PRN

## 2019-06-25 MED ORDER — HYDROCHLOROTHIAZIDE 25 MG PO TABS: 25.0000 mg | ORAL_TABLET | Freq: Every day | ORAL | 1 refills | Status: DC

## 2019-06-25 MED ORDER — HYDRALAZINE HCL 20 MG/ML IJ SOLN
INTRAMUSCULAR | Status: AC
  Administered 2019-06-25: 5 mg via INTRAVENOUS
  Filled 2019-06-25: qty 1

## 2019-06-25 MED ORDER — HYDRALAZINE HCL 20 MG/ML IJ SOLN: 5.0000 mg | INTRAMUSCULAR | Status: DC | PRN

## 2019-06-25 MED ORDER — OXYCODONE-ACETAMINOPHEN 5-325 MG PO TABS: 1.0000 | ORAL_TABLET | Freq: Four times a day (QID) | ORAL | 0 refills | Status: DC | PRN

## 2019-06-25 MED ORDER — HYDRALAZINE HCL 20 MG/ML IJ SOLN
10.0000 mg | INTRAMUSCULAR | Status: DC | PRN
  Administered 2019-06-25: 10 mg via INTRAVENOUS

## 2019-06-25 MED ORDER — HYDROCHLOROTHIAZIDE 25 MG PO TABS
25.0000 mg | ORAL_TABLET | Freq: Every day | ORAL | Status: DC
  Administered 2019-06-25: 25 mg via ORAL
  Filled 2019-06-25: qty 1

## 2019-06-25 MED ORDER — LABETALOL HCL 5 MG/ML IV SOLN: 20.0000 mg | INTRAVENOUS | Status: DC | PRN

## 2019-06-25 MED ORDER — AMLODIPINE BESYLATE 10 MG PO TABS: 10.0000 mg | ORAL_TABLET | Freq: Every day | ORAL | 1 refills | Status: DC

## 2019-06-25 MED ORDER — NORETHINDRONE 0.35 MG PO TABS: 1.0000 | ORAL_TABLET | Freq: Every day | ORAL | 3 refills | Status: DC

## 2019-06-25 NOTE — Transfer of Care (Signed)
Immediate Anesthesia Transfer of Care Note  Patient: Tricia Ray  Procedure(s) Performed: CESAREAN SECTION (N/A )  Patient Location: PACU  Anesthesia Type:Regional  Level of Consciousness: awake, alert  and oriented  Airway & Oxygen Therapy: Patient Spontanous Breathing  Post-op Assessment: Post -op Vital signs reviewed and stable  Post vital signs: Reviewed and stable  Last Vitals:  Vitals Value Taken Time  BP 159/83 06/25/19 1055  Temp 36.8 C 06/24/19 2331  Pulse 92 06/25/19 1055  Resp 18 06/24/19 2331  SpO2 99 % 06/25/19 0845  Vitals shown include unvalidated device data.  Last Pain:  Vitals:   06/25/19 1046  TempSrc:   PainSc: 4       Patients Stated Pain Goal: 3 (06/24/19 1727)  Complications: No apparent anesthesia complications

## 2019-06-25 NOTE — Progress Notes (Signed)
Subjective: Postpartum Day 2: Cesarean Delivery Patient reports incisional pain, tolerating PO, + flatus and no problems voiding.    Objective: Vital signs in last 24 hours: Temp:  [98.1 F (36.7 C)-98.4 F (36.9 C)] 98.3 F (36.8 C) (01/19 2331) Pulse Rate:  [84-100] 86 (01/20 0519) Resp:  [18-19] 18 (01/19 2331) BP: (144-194)/(74-112) 159/92 (01/20 0519) SpO2:  [98 %-100 %] 99 % (01/19 2331)  Physical Exam:  General: alert, cooperative and no distress Lochia: appropriate Uterine Fundus: firm Incision: healing well, no significant drainage, no dehiscence DVT Evaluation: No evidence of DVT seen on physical exam.  Recent Labs    06/23/19 0901 06/24/19 0602  HGB 10.5* 9.8*  HCT 33.5* 31.1*    Assessment/Plan: Status post Cesarean section. Doing well postoperatively.  Will check BP following medications this AM.  Rhona Raider [facility] 06/25/2019, 8:05 AM

## 2019-06-25 NOTE — Discharge Instructions (Signed)
Postpartum Hypertension Postpartum hypertension is high blood pressure that remains higher than normal after childbirth. You may not realize that you have postpartum hypertension if your blood pressure is not being checked regularly. In most cases, postpartum hypertension will go away on its own, usually within a week of delivery. However, for some women, medical treatment is required to prevent serious complications, such as seizures or stroke. What are the causes? This condition may be caused by one or more of the following:  Hypertension that existed before pregnancy (chronic hypertension).  Hypertension that comes on as a result of pregnancy (gestational hypertension).  Hypertensive disorders during pregnancy (preeclampsia) or seizures in women who have high blood pressure during pregnancy (eclampsia).  A condition in which the liver, platelets, and red blood cells are damaged during pregnancy (HELLP syndrome).  A condition in which the thyroid produces too much hormones (hyperthyroidism).  Other rare problems of the nerves (neurological disorders) or blood disorders. In some cases, the cause may not be known. What increases the risk? The following factors may make you more likely to develop this condition:  Chronic hypertension. In some cases, this may not have been diagnosed before pregnancy.  Obesity.  Type 2 diabetes.  Kidney disease.  History of preeclampsia or eclampsia.  Other medical conditions that change the level of hormones in the body (hormonal imbalance). What are the signs or symptoms? As with all types of hypertension, postpartum hypertension may not have any symptoms. Depending on how high your blood pressure is, you may experience:  Headaches. These may be mild, moderate, or severe. They may also be steady, constant, or sudden in onset (thunderclap headache).  Changes in your ability to see (visual changes).  Dizziness.  Shortness of breath.  Swelling  of your hands, feet, lower legs, or face. In some cases, you may have swelling in more than one of these locations.  Heart palpitations or a racing heartbeat.  Difficulty breathing while lying down.  Decrease in the amount of urine that you pass. Other rare signs and symptoms may include:  Sweating more than usual. This lasts longer than a few days after delivery.  Chest pain.  Sudden dizziness when you get up from sitting or lying down.  Seizures.  Nausea or vomiting.  Abdominal pain. How is this diagnosed? This condition may be diagnosed based on the results of a physical exam, blood pressure measurements, and blood and urine tests. You may also have other tests, such as a CT scan or an MRI, to check for other problems of postpartum hypertension. How is this treated? If blood pressure is high enough to require treatment, your options may include:  Medicines to reduce blood pressure (antihypertensives). Tell your health care provider if you are breastfeeding or if you plan to breastfeed. There are many antihypertensive medicines that are safe to take while breastfeeding.  Stopping medicines that may be causing hypertension.  Treating medical conditions that are causing hypertension.  Treating the complications of hypertension, such as seizures, stroke, or kidney problems. Your health care provider will also continue to monitor your blood pressure closely until it is within a safe range for you. Follow these instructions at home:  Take over-the-counter and prescription medicines only as told by your health care provider.  Return to your normal activities as told by your health care provider. Ask your health care provider what activities are safe for you.  Do not use any products that contain nicotine or tobacco, such as cigarettes and e-cigarettes. If   you need help quitting, ask your health care provider.  Keep all follow-up visits as told by your health care provider. This  is important. Contact a health care provider if:  Your symptoms get worse.  You have new symptoms, such as: ? A headache that does not get better. ? Dizziness. ? Visual changes. Get help right away if:  You suddenly develop swelling in your hands, ankles, or face.  You have sudden, rapid weight gain.  You develop difficulty breathing, chest pain, racing heartbeat, or heart palpitations.  You develop severe pain in your abdomen.  You have any symptoms of a stroke. "BE FAST" is an easy way to remember the main warning signs of a stroke: ? B - Balance. Signs are dizziness, sudden trouble walking, or loss of balance. ? E - Eyes. Signs are trouble seeing or a sudden change in vision. ? F - Face. Signs are sudden weakness or numbness of the face, or the face or eyelid drooping on one side. ? A - Arms. Signs are weakness or numbness in an arm. This happens suddenly and usually on one side of the body. ? S - Speech. Signs are sudden trouble speaking, slurred speech, or trouble understanding what people say. ? T - Time. Time to call emergency services. Write down what time symptoms started.  You have other signs of a stroke, such as: ? A sudden, severe headache with no known cause. ? Nausea or vomiting. ? Seizure. These symptoms may represent a serious problem that is an emergency. Do not wait to see if the symptoms will go away. Get medical help right away. Call your local emergency services (911 in the [place] Do not drive yourself to the hospital. Summary  Postpartum hypertension is high blood pressure that remains higher than normal after childbirth.  In most cases, postpartum hypertension will go away on its own, usually within a week of delivery.  For some women, medical treatment is required to prevent serious complications, such as seizures or stroke. This information is not intended to replace advice given to you by your health care provider. Make sure you discuss any questions  you have with your health care provider. Document Revised: 06/28/2018 Document Reviewed: 03/12/2017 Elsevier Patient Education  2020 Elsevier Inc. Cesarean Delivery, Care After This sheet gives you information about how to care for yourself after your procedure. Your health care provider may also give you more specific instructions. If you have problems or questions, contact your health care provider. What can I expect after the procedure? After the procedure, it is common to have:  A small amount of blood or clear fluid coming from the incision.  Some redness, swelling, and pain in your incision area.  Some abdominal pain and soreness.  Vaginal bleeding (lochia). Even though you did not have a vaginal delivery, you will still have vaginal bleeding and discharge.  Pelvic cramps.  Fatigue. You may have pain, swelling, and discomfort in the tissue between your vagina and your anus (perineum) if:  Your C-section was unplanned, and you were allowed to labor and push.  An incision was made in the area (episiotomy) or the tissue tore during attempted vaginal delivery. Follow these instructions at home: Incision care   Follow instructions from your health care provider about how to take care of your incision. Make sure you: ? Wash your hands with soap and water before you change your bandage (dressing). If soap and water are not available, use hand sanitizer. ? If you  have a dressing, change it or remove it as told by your health care provider. ? Leave stitches (sutures), skin staples, skin glue, or adhesive strips in place. These skin closures may need to stay in place for 2 weeks or longer. If adhesive strip edges start to loosen and curl up, you may trim the loose edges. Do not remove adhesive strips completely unless your health care provider tells you to do that.  Check your incision area every day for signs of infection. Check for: ? More redness, swelling, or pain. ? More fluid or  blood. ? Warmth. ? Pus or a bad smell.  Do not take baths, swim, or use a hot tub until your health care provider says it's okay. Ask your health care provider if you can take showers.  When you cough or sneeze, hug a pillow. This helps with pain and decreases the chance of your incision opening up (dehiscing). Do this until your incision heals. Medicines  Take over-the-counter and prescription medicines only as told by your health care provider.  If you were prescribed an antibiotic medicine, take it as told by your health care provider. Do not stop taking the antibiotic even if you start to feel better.  Do not drive or use heavy machinery while taking prescription pain medicine. Lifestyle  Do not drink alcohol. This is especially important if you are breastfeeding or taking pain medicine.  Do not use any products that contain nicotine or tobacco, such as cigarettes, e-cigarettes, and chewing tobacco. If you need help quitting, ask your health care provider. Eating and drinking  Drink at least 8 eight-ounce glasses of water every day unless told not to by your health care provider. If you breastfeed, you may need to drink even more water.  Eat high-fiber foods every day. These foods may help prevent or relieve constipation. High-fiber foods include: ? Whole grain cereals and breads. ? Brown rice. ? Beans. ? Fresh fruits and vegetables. Activity   If possible, have someone help you care for your baby and help with household activities for at least a few days after you leave the hospital.  Return to your normal activities as told by your health care provider. Ask your health care provider what activities are safe for you.  Rest as much as possible. Try to rest or take a nap while your baby is sleeping.  Do not lift anything that is heavier than 10 lbs (4.5 kg), or the limit that you were told, until your health care provider says that it is safe.  Talk with your health care  provider about when you can engage in sexual activity. This may depend on your: ? Risk of infection. ? How fast you heal. ? Comfort and desire to engage in sexual activity. General instructions  Do not use tampons or douches until your health care provider approves.  Wear loose, comfortable clothing and a supportive and well-fitting bra.  Keep your perineum clean and dry. Wipe from front to back when you use the toilet.  If you pass a blood clot, save it and call your health care provider to discuss. Do not flush blood clots down the toilet before you get instructions from your health care provider.  Keep all follow-up visits for you and your baby as told by your health care provider. This is important. Contact a health care provider if:  You have: ? A fever. ? Bad-smelling vaginal discharge. ? Pus or a bad smell coming from your incision. ?   Difficulty or pain when urinating. ? A sudden increase or decrease in the frequency of your bowel movements. ? More redness, swelling, or pain around your incision. ? More fluid or blood coming from your incision. ? A rash. ? Nausea. ? Little or no interest in activities you used to enjoy. ? Questions about caring for yourself or your baby.  Your incision feels warm to the touch.  Your breasts turn red or become painful or hard.  You feel unusually sad or worried.  You vomit.  You pass a blood clot from your vagina.  You urinate more than usual.  You are dizzy or light-headed. Get help right away if:  You have: ? Pain that does not go away or get better with medicine. ? Chest pain. ? Difficulty breathing. ? Blurred vision or spots in your vision. ? Thoughts about hurting yourself or your baby. ? New pain in your abdomen or in one of your legs. ? A severe headache.  You faint.  You bleed from your vagina so much that you fill more than one sanitary pad in one hour. Bleeding should not be heavier than your heaviest  period. Summary  After the procedure, it is common to have pain at your incision site, abdominal cramping, and slight bleeding from your vagina.  Check your incision area every day for signs of infection.  Tell your health care provider about any unusual symptoms.  Keep all follow-up visits for you and your baby as told by your health care provider. This information is not intended to replace advice given to you by your health care provider. Make sure you discuss any questions you have with your health care provider. Document Revised: 11/28/2017 Document Reviewed: 11/28/2017 Elsevier Patient Education  2020 Elsevier Inc.  

## 2019-06-26 ENCOUNTER — Other Ambulatory Visit: Payer: Self-pay | Admitting: Obstetrics and Gynecology

## 2019-07-03 ENCOUNTER — Other Ambulatory Visit: Payer: Self-pay | Admitting: *Deleted

## 2019-07-03 MED ORDER — TRAMADOL HCL 50 MG PO TABS: 50.0000 mg | ORAL_TABLET | Freq: Four times a day (QID) | ORAL | 0 refills | Status: DC | PRN

## 2019-07-03 NOTE — Addendum Note (Signed)
Addended by: Jill Side on: 07/03/2019 03:54 PM   Modules accepted: Orders

## 2019-07-08 ENCOUNTER — Ambulatory Visit: Payer: Medicaid Other

## 2019-07-17 ENCOUNTER — Ambulatory Visit: Payer: Medicaid Other

## 2019-07-31 ENCOUNTER — Telehealth: Payer: Medicaid Other | Admitting: Obstetrics and Gynecology

## 2019-08-05 ENCOUNTER — Other Ambulatory Visit: Payer: Medicaid Other

## 2019-08-05 ENCOUNTER — Other Ambulatory Visit: Payer: Self-pay

## 2019-08-06 ENCOUNTER — Telehealth: Payer: Medicaid Other | Admitting: Obstetrics and Gynecology

## 2019-08-06 ENCOUNTER — Other Ambulatory Visit: Payer: Self-pay

## 2019-08-06 DIAGNOSIS — Z91199 Patient's noncompliance with other medical treatment and regimen due to unspecified reason: Secondary | ICD-10-CM

## 2019-08-06 DIAGNOSIS — Z5329 Procedure and treatment not carried out because of patient's decision for other reasons: Secondary | ICD-10-CM

## 2019-08-06 NOTE — Progress Notes (Signed)
Called pt at [phone]; VM is full, unable to leave a message.   Called pt at [phone]; VM is full, unable to leave a message.  Fleet Contras, RN 08/06/19

## 2019-08-06 NOTE — Progress Notes (Signed)
Patient did not keep her postpartum appointment for 08/06/2019.  Cornelia Copa MD Attending Center for [employer] Mid[facility])

## 2019-08-08 ENCOUNTER — Other Ambulatory Visit: Payer: Medicaid Other

## 2019-08-08 NOTE — Addendum Note (Signed)
Addended by: Marjo Bicker on: 08/08/2019 08:36 AM   Modules accepted: Orders

## 2019-09-08 ENCOUNTER — Ambulatory Visit: Payer: Medicaid Other | Admitting: Obstetrics and Gynecology

## 2019-09-08 ENCOUNTER — Other Ambulatory Visit: Payer: Medicaid Other

## 2019-09-09 ENCOUNTER — Ambulatory Visit: Payer: Medicaid Other | Admitting: Obstetrics & Gynecology

## 2019-09-09 ENCOUNTER — Other Ambulatory Visit: Payer: Medicaid Other

## 2019-09-22 ENCOUNTER — Other Ambulatory Visit: Payer: Medicaid Other

## 2019-09-22 ENCOUNTER — Ambulatory Visit: Payer: Medicaid Other | Admitting: Nurse Practitioner

## 2019-11-13 ENCOUNTER — Ambulatory Visit: Payer: Medicaid Other | Attending: Internal Medicine

## 2019-12-06 ENCOUNTER — Encounter (HOSPITAL_COMMUNITY): Payer: Self-pay | Admitting: *Deleted

## 2019-12-06 ENCOUNTER — Ambulatory Visit (HOSPITAL_COMMUNITY)
Admission: EM | Admit: 2019-12-06 | Discharge: 2019-12-06 | Disposition: A | Discharge status: Home / Self Care | Payer: Medicaid Other | Attending: Family Medicine | Admitting: Family Medicine

## 2019-12-06 ENCOUNTER — Other Ambulatory Visit: Payer: Self-pay

## 2019-12-06 DIAGNOSIS — Z202 Contact with and (suspected) exposure to infections with a predominantly sexual mode of transmission: Secondary | ICD-10-CM | POA: Diagnosis present

## 2019-12-06 DIAGNOSIS — Z3202 Encounter for pregnancy test, result negative: Secondary | ICD-10-CM

## 2019-12-06 DIAGNOSIS — J029 Acute pharyngitis, unspecified: Secondary | ICD-10-CM | POA: Diagnosis present

## 2019-12-06 HISTORY — DX: Essential (primary) hypertension: I10

## 2019-12-06 LAB — POCT URINALYSIS DIP (DEVICE)
Bilirubin Urine: NEGATIVE
Glucose, UA: NEGATIVE mg/dL
Ketones, ur: NEGATIVE mg/dL
Nitrite: POSITIVE — AB
Protein, ur: NEGATIVE mg/dL
Specific Gravity, Urine: 1.01 (ref 1.005–1.030)
Urobilinogen, UA: 0.2 mg/dL (ref 0.0–1.0)
pH: 6 (ref 5.0–8.0)

## 2019-12-06 LAB — POC URINE PREG, ED: Preg Test, Ur: NEGATIVE

## 2019-12-06 LAB — POCT RAPID STREP A: Streptococcus, Group A Screen (Direct): NEGATIVE

## 2019-12-06 MED ORDER — CEFTRIAXONE SODIUM 500 MG IJ SOLR
INTRAMUSCULAR | Status: AC
  Filled 2019-12-06: qty 500

## 2019-12-06 MED ORDER — LIDOCAINE HCL (PF) 1 % IJ SOLN
INTRAMUSCULAR | Status: AC
  Filled 2019-12-06: qty 2

## 2019-12-06 MED ORDER — CEFTRIAXONE SODIUM 500 MG IJ SOLR
500.0000 mg | Freq: Once | INTRAMUSCULAR | Status: AC
  Administered 2019-12-06: 500 mg via INTRAMUSCULAR

## 2019-12-06 MED ORDER — DOXYCYCLINE HYCLATE 100 MG PO CAPS
100.0000 mg | ORAL_CAPSULE | Freq: Two times a day (BID) | ORAL | 0 refills | Status: DC
Start: 2019-12-06 — End: 2020-04-13

## 2019-12-06 NOTE — ED Triage Notes (Addendum)
Pt states had oral sex and sexual intercourse 4 days ago; was then informed by partner yesterday that he tested positive for gonorrhea.  Pt started with sore throat and "white spots" 2 days ago; concerned about gonorrheal throat infection.  Denies any vaginal discharge. Pt requesting STD testing, pregnancy testing.

## 2019-12-06 NOTE — Discharge Instructions (Signed)

## 2019-12-07 LAB — URINE CULTURE

## 2019-12-08 LAB — CULTURE, GROUP A STREP (THRC)

## 2019-12-08 NOTE — ED Provider Notes (Signed)
[facility]   [id number] 12/06/19 Arrival Time: 1455  ASSESSMENT & PLAN:  1. Sore throat   2. STD exposure     Meds ordered this encounter  Medications  . cefTRIAXone (ROCEPHIN) injection 500 mg  . doxycycline (VIBRAMYCIN) 100 MG capsule    Sig: Take 1 capsule (100 mg total) by mouth 2 (two) times daily.    Dispense:  14 capsule    Refill:  0      Discharge Instructions     You have been given the following today for treatment of suspected gonorrhea and/or chlamydia:  cefTRIAXone (ROCEPHIN) injection 500 mg  Please pick up your prescription for doxycycline 100 mg and begin taking twice daily for the next seven (7) days.  Even though we have treated you today, we have sent testing for sexually transmitted infections. We will notify you of any positive results once they are received. If required, we will prescribe any medications you might need.  Please refrain from all sexual activity for at least the next seven days.     Without s/s of PID.  Labs Reviewed  POCT URINALYSIS DIP (DEVICE) - Abnormal; Notable for the following components:   Hgb urine dipstick TRACE (*)    Nitrite POSITIVE (*)    Leukocytes,Ua SMALL (*)    All other components within normal limits  CULTURE, GROUP A STREP ([facility])  POC URINE PREG, ED  POC URINE PREG, ED  POCT RAPID STREP A  CERVICOVAGINAL ANCILLARY ONLY  CYTOLOGY, (ORAL) ANCILLARY ONLY     Will notify of any positive results. Instructed to refrain from sexual activity for at least seven days.  Reviewed expectations re: course of current medical issues. Questions answered. Outlined signs and symptoms indicating need for more acute intervention. Patient verbalized understanding. After Visit Summary given.   SUBJECTIVE:  Tricia Ray is a 29 y.o. female who presents with complaint of exposure to gonorrhea. Vaginal and oral sex four days ago. Mild sore throat today. Afebrile. No abdominal pain or vaginal  discharge.  Patient's last menstrual period was 11/13/2019 (approximate).   OBJECTIVE:  Vitals:   12/06/19 1516  BP: 121/71  Pulse: (!) 104  Resp: 20  Temp: 99.5 F (37.5 C)  TempSrc: Oral  SpO2: 99%     General appearance: alert, cooperative, appears stated age and no distress Throat: mild tonsil erythema and enlargement with slight exudate Neck: no LAD Lungs: unlabored respirations; speaks full sentences without difficulty Back: no CVA tenderness; FROM at waist Abdomen: soft, non-tender GU: deferrred Skin: warm and dry Psychological: alert and cooperative; normal mood and affect.  Results for orders placed or performed during the hospital encounter of 12/06/19  Culture, group A strep (throat)   Specimen: Throat  Result Value Ref Range   Specimen Description THROAT    Special Requests NONE    Culture      CULTURE REINCUBATED FOR BETTER GROWTH Performed at [facility], [address] [place], [place] [address]    Report Status PENDING   Urine culture   Specimen: Urine, Random  Result Value Ref Range   Specimen Description URINE, RANDOM    Special Requests      NONE Performed at [facility], [address] [place], [place] [address]    Culture MULTIPLE SPECIES PRESENT, SUGGEST RECOLLECTION (A)    Report Status 12/07/2019 FINAL   POC urine pregnancy  Result Value Ref Range   Preg Test, Ur NEGATIVE NEGATIVE  POCT urinalysis dip (device)  Result Value Ref Range   Glucose, UA NEGATIVE NEGATIVE mg/dL   Bilirubin Urine NEGATIVE NEGATIVE   Ketones, ur NEGATIVE NEGATIVE mg/dL   Specific Gravity, Urine 1.010 1.005 - 1.030   Hgb urine dipstick TRACE (A) NEGATIVE   pH 6.0 5.0 - 8.0   Protein, ur NEGATIVE NEGATIVE mg/dL   Urobilinogen, UA 0.2 0.0 - 1.0 mg/dL   Nitrite POSITIVE (A) NEGATIVE   Leukocytes,Ua SMALL (A) NEGATIVE  POCT rapid strep A [facility])  Result Value Ref Range   Streptococcus, Group A Screen (Direct) NEGATIVE NEGATIVE     Labs Reviewed  URINE CULTURE - Abnormal; Notable for the following components:      Result Value   Culture MULTIPLE SPECIES PRESENT, SUGGEST RECOLLECTION (*)    All other components within normal limits  POCT URINALYSIS DIP (DEVICE) - Abnormal; Notable for the following components:   Hgb urine dipstick TRACE (*)    Nitrite POSITIVE (*)    Leukocytes,Ua SMALL (*)    All other components within normal limits  CULTURE, GROUP A STREP ([facility])  POC URINE PREG, ED  POC URINE PREG, ED  POCT RAPID STREP A  CERVICOVAGINAL ANCILLARY ONLY  CYTOLOGY, (ORAL, ANAL, URETHRAL) ANCILLARY ONLY    Allergies  Allergen Reactions  . Shrimp [Shellfish Allergy] Anaphylaxis    Itching and swelling in mounth  . Aleve [Naproxen Sodium] Hives and Swelling  . Cherry Hives, Itching and Swelling  . Penicillins Other (See Comments)    Causes pink eye, blurry vision Did it involve swelling of the face/tongue/throat, SOB, or low BP? No Did it involve sudden or severe rash/hives, skin peeling, or any reaction on the inside of your mouth or nose? No Did you need to seek medical attention at a hospital or doctor's office? No When did it last happen?2018 If all above answers are "NO", may proceed with cephalosporin use.     Past Medical History:  Diagnosis Date  . Anemia   . Asthma   . Gestational diabetes   . Hypertension    pregnancy induced  . Morbid obesity (HCC)   . PCOS (polycystic ovarian syndrome)   . Seasonal allergies    Family History  Problem Relation Age of Onset  . Cancer Mother   . Leukemia Mother   . Diabetes Mother    Social History   Socioeconomic History  . Marital status: Single    Spouse name: Not on file  . Number of children: Not on file  . Years of education: Not on file  . Highest education level: Not on file  Occupational History  . Not on file  Tobacco Use  . Smoking status: Never Smoker  . Smokeless tobacco: Never Used  Vaping Use  . Vaping Use: Never  used  Substance and Sexual Activity  . Alcohol use: Not Currently  . Drug use: Yes    Types: Marijuana  . Sexual activity: Yes    Birth control/protection: Condom  Other Topics Concern  . Not on file  Social History Narrative  . Not on file   Social Determinants of Health   Financial Resource Strain:   . Difficulty of Paying Living Expenses:   Food Insecurity: No Food Insecurity  . Worried About Programme researcher, broadcasting/film/video in the Last Year: Never true  . Ran Out of Food in the Last Year: Never true  Transportation Needs: No Transportation Needs  . Lack of Transportation (Medical): No  . Lack of Transportation (Non-Medical): No  Physical  Activity:   . Days of Exercise per Week:   . Minutes of Exercise per Session:   Stress:   . Feeling of Stress :   Social Connections:   . Frequency of Communication with Friends and Family:   . Frequency of Social Gatherings with Friends and Family:   . Attends Religious Services:   . Active Member of Clubs or Organizations:   . Attends Banker Meetings:   Marland Kitchen Marital Status:   Intimate Partner Violence:   . Fear of Current or Ex-Partner:   . Emotionally Abused:   Marland Kitchen Physically Abused:   . Sexually Abused:           Mardella Layman, MD 12/08/19 [phone]

## 2019-12-09 ENCOUNTER — Telehealth (HOSPITAL_COMMUNITY): Payer: Self-pay

## 2019-12-09 DIAGNOSIS — J029 Acute pharyngitis, unspecified: Secondary | ICD-10-CM

## 2019-12-09 LAB — CERVICOVAGINAL ANCILLARY ONLY
Bacterial Vaginitis (gardnerella): POSITIVE — AB
Chlamydia: NEGATIVE
Comment: NEGATIVE
Comment: NEGATIVE
Comment: NEGATIVE
Comment: NORMAL
Neisseria Gonorrhea: POSITIVE — AB
Trichomonas: NEGATIVE

## 2019-12-09 LAB — CYTOLOGY, (ORAL, ANAL, URETHRAL) ANCILLARY ONLY
Chlamydia: NEGATIVE
Comment: NEGATIVE
Comment: NORMAL
Neisseria Gonorrhea: POSITIVE — AB

## 2019-12-09 MED ORDER — AZITHROMYCIN 250 MG PO TABS: 250.0000 mg | ORAL_TABLET | Freq: Every day | ORAL | 0 refills | Status: DC

## 2019-12-10 ENCOUNTER — Telehealth: Payer: Self-pay | Admitting: Emergency Medicine

## 2019-12-10 MED ORDER — METRONIDAZOLE 500 MG PO TABS: 500.0000 mg | ORAL_TABLET | Freq: Two times a day (BID) | ORAL | 0 refills | Status: DC

## 2019-12-10 NOTE — Telephone Encounter (Signed)
Returned patient's call concerning her labs and continuing symptoms.  Patient states sore throat is improving, but she continues to have vaginal discomfort.  Will send in Metronidazole 500mg  BID x 7 days per protocol to patient's preferred pharmacy, which she confirmed while on the phone.  Not drinking alcohol during use discussed with patient.  Patient verbalized understanding.

## 2020-01-02 DIAGNOSIS — R2242 Localized swelling, mass and lump, left lower limb: Secondary | ICD-10-CM | POA: Insufficient documentation

## 2020-01-02 DIAGNOSIS — M79661 Pain in right lower leg: Secondary | ICD-10-CM | POA: Insufficient documentation

## 2020-01-02 DIAGNOSIS — Z5321 Procedure and treatment not carried out due to patient leaving prior to being seen by health care provider: Secondary | ICD-10-CM | POA: Diagnosis not present

## 2020-01-03 ENCOUNTER — Emergency Department (HOSPITAL_COMMUNITY)
Admission: EM | Admit: 2020-01-03 | Discharge: 2020-01-03 | Disposition: A | Discharge status: Home / Self Care | Payer: Medicaid Other | Attending: Emergency Medicine | Admitting: Emergency Medicine

## 2020-01-03 ENCOUNTER — Encounter (HOSPITAL_COMMUNITY): Payer: Self-pay | Admitting: Emergency Medicine

## 2020-01-03 NOTE — ED Notes (Signed)
Pt called x 3  No answer. 

## 2020-01-03 NOTE — ED Triage Notes (Signed)
Sent by PCP for elevated D-Dimer. Patient reports intermittent swelling and pain to RLE X3 months.

## 2020-01-06 ENCOUNTER — Other Ambulatory Visit: Payer: Self-pay | Admitting: Family

## 2020-01-07 ENCOUNTER — Other Ambulatory Visit: Payer: Self-pay | Admitting: Family

## 2020-01-07 DIAGNOSIS — M25571 Pain in right ankle and joints of right foot: Secondary | ICD-10-CM

## 2020-01-12 ENCOUNTER — Ambulatory Visit
Admission: RE | Admit: 2020-01-12 | Discharge: 2020-01-12 | Disposition: A | Discharge status: Home / Self Care | Payer: Medicaid Other | Source: Ambulatory Visit | Attending: Family | Admitting: Family

## 2020-01-12 DIAGNOSIS — M25571 Pain in right ankle and joints of right foot: Secondary | ICD-10-CM

## 2020-04-13 ENCOUNTER — Other Ambulatory Visit: Payer: Self-pay

## 2020-04-13 ENCOUNTER — Encounter (HOSPITAL_COMMUNITY): Payer: Self-pay | Admitting: Emergency Medicine

## 2020-04-13 ENCOUNTER — Ambulatory Visit (HOSPITAL_COMMUNITY)
Admission: EM | Admit: 2020-04-13 | Discharge: 2020-04-13 | Disposition: A | Discharge status: Home / Self Care | Payer: Medicaid Other | Attending: Family Medicine | Admitting: Family Medicine

## 2020-04-13 DIAGNOSIS — J029 Acute pharyngitis, unspecified: Secondary | ICD-10-CM | POA: Diagnosis present

## 2020-04-13 LAB — POCT RAPID STREP A, ED / UC: Streptococcus, Group A Screen (Direct): NEGATIVE

## 2020-04-13 MED ORDER — LIDOCAINE HCL (PF) 1 % IJ SOLN
INTRAMUSCULAR | Status: AC
  Filled 2020-04-13: qty 2

## 2020-04-13 MED ORDER — CEFTRIAXONE SODIUM 500 MG IJ SOLR
500.0000 mg | Freq: Once | INTRAMUSCULAR | Status: AC
  Administered 2020-04-13: 500 mg via INTRAMUSCULAR

## 2020-04-13 MED ORDER — CEFTRIAXONE SODIUM 500 MG IJ SOLR
INTRAMUSCULAR | Status: AC
  Filled 2020-04-13: qty 500

## 2020-04-13 NOTE — ED Provider Notes (Signed)
[facility]    [name]: [id number] Arrival date & time: 04/13/20  0809      History   Chief Complaint Chief Complaint  Patient presents with  . Sore Throat    HPI Tricia Ray is a 29 y.o. female.   Patient is a 29 year old female presents today with sore throat.  Is been present since yesterday.  Tonsillar swelling and exudates.  Concern due to history of gonorrhea from oral sex.  She was treated for this and denies any new recent exposures.  Mild pain with swallowing.  No trouble swallowing or breathing.  In a relationship with a female partner.      Past Medical History:  Diagnosis Date  . Anemia   . Asthma   . Gestational diabetes   . Hypertension    pregnancy induced  . Morbid obesity (HCC)   . PCOS (polycystic ovarian syndrome)   . Seasonal allergies     Patient Active Problem List   Diagnosis Date Noted  . Diabetes in pregnancy 06/21/2019  . Chronic hypertension 06/21/2019  . COVID-19 virus infection 06/21/2019  . Severe pre-eclampsia 06/21/2019  . Low vitamin D level 05/25/2019  . BMI 50.0-59.9, adult (HCC) 05/21/2019  . LGA (large for gestational age) fetus affecting management of mother 05/21/2019  . Chronic hypertension during pregnancy 05/13/2019  . Obesity affecting pregnancy 05/09/2019  . Supervision of high risk pregnancy, antepartum 04/23/2019  . GDM (gestational diabetes mellitus) 04/23/2019  . UTI (urinary tract infection) during pregnancy 04/23/2019  . Asthma   . PCOS (polycystic ovarian syndrome)     Past Surgical History:  Procedure Laterality Date  . CESAREAN SECTION N/A 06/23/2019   Procedure: CESAREAN SECTION;  Surgeon: Adam Phenix, MD;  Location: [facility] LD ORS;  Service: Obstetrics;  Laterality: N/A;  . HAND SURGERY      OB History    Gravida  1   Para  1   Term  1   Preterm      AB      Living  1     SAB      TAB      Ectopic      Multiple  0   Live Births  1            Home Medications     Prior to Admission medications   Medication Sig Start Date End Date Taking? Authorizing Provider  Accu-Chek Softclix Lancets lancets Use as instructed 05/07/19   Fair, Hoyle Sauer, MD  amLODipine (NORVASC) 10 MG tablet Take 1 tablet (10 mg total) by mouth daily. 06/26/19   Reva Bores, MD  aspirin EC 81 MG tablet Take 1 tablet (81 mg total) by mouth daily. Take after 12 weeks for prevention of preeclampsia later in pregnancy 05/13/19   Levie Heritage, DO  ferrous sulfate 325 (65 FE) MG tablet Take 1 tablet (325 mg total) by mouth daily with breakfast. 04/29/19   Fair, Hoyle Sauer, MD  glucose blood (ACCU-CHEK GUIDE) test strip Use as instructed 04/29/19   Fair, Hoyle Sauer, MD  hydrochlorothiazide (HYDRODIURIL) 25 MG tablet Take 1 tablet (25 mg total) by mouth daily. 06/26/19   Reva Bores, MD  Insulin Syringe-Needle U-100 (INSULIN SYRINGE 1CC/31GX5/16") 31G X 5/16" 1 ML MISC 1 Device by Does not apply route 4 (four) times daily. 05/26/19   Fair, Hoyle Sauer, MD  labetalol (NORMODYNE) 200 MG tablet Take 3 tablets (600 mg total) by mouth 2 (two) times daily. 06/11/19  Adam Phenix, MD  metFORMIN (GLUCOPHAGE) 500 MG tablet Take 1 tablet at bedtime nightly 06/04/19   Dove, Myra C, MD  norethindrone (MICRONOR) 0.35 MG tablet Take 1 tablet (0.35 mg total) by mouth daily. Begin 4-6 weeks after birth 06/25/19   Reva Bores, MD  Prenatal Vit-Fe Fumarate-FA (PRENATAL MULTIVITAMIN) TABS tablet Take 1 tablet by mouth daily at 12 noon.    [provider]  Vitamin D, Ergocalciferol, (DRISDOL) 1.25 MG (50000 UNIT) CAPS capsule Take 1 capsule by mouth once a week 06/27/19   Chittenango Bing, MD    Family History Family History  Problem Relation Age of Onset  . Cancer Mother   . Leukemia Mother   . Diabetes Mother     Social History Social History   Tobacco Use  . Smoking status: Never Smoker  . Smokeless tobacco: Never Used  Vaping Use  . Vaping Use: Never used  Substance Use Topics  .  Alcohol use: Not Currently  . Drug use: Yes    Types: Marijuana     Allergies   Shrimp [shellfish allergy], Aleve [naproxen sodium], Cherry, and Penicillins   Review of Systems Review of Systems   Physical Exam Triage Vital Signs ED Triage Vitals  Enc Vitals Group     BP 04/13/20 0900 135/87     Pulse Rate 04/13/20 0859 81     Resp 04/13/20 0859 16     Temp 04/13/20 0859 98.6 F (37 C)     Temp Source 04/13/20 0859 Oral     SpO2 04/13/20 0859 98 %     Weight --      Height --      Head Circumference --      Peak Flow --      Pain Score 04/13/20 0856 5     Pain Loc --      Pain Edu? --      Excl. in GC? --    No data found.  Updated Vital Signs BP 135/87   Pulse 81   Temp 98.6 F (37 C) (Oral)   Resp 16   LMP 04/10/2020   SpO2 98%   Visual Acuity Right Eye Distance:   Left Eye Distance:   Bilateral Distance:    Right Eye Near:   Left Eye Near:    Bilateral Near:     Physical Exam Vitals and nursing note reviewed.  Constitutional:      General: She is not in acute distress.    Appearance: Normal appearance. She is not ill-appearing, toxic-appearing or diaphoretic.  HENT:     Head: Normocephalic.     Nose: Nose normal.     Mouth/Throat:     Pharynx: Posterior oropharyngeal erythema and uvula swelling present. No pharyngeal swelling.     Tonsils: Tonsillar exudate present. 2+ on the right. 2+ on the left.  Pulmonary:     Effort: Pulmonary effort is normal.  Musculoskeletal:        General: Normal range of motion.     Cervical back: Normal range of motion.  Skin:    General: Skin is warm and dry.     Findings: No rash.  Neurological:     Mental Status: She is alert.  Psychiatric:        Mood and Affect: Mood normal.      UC Treatments / Results  Labs (all labs ordered are listed, but only abnormal results are displayed) Labs Reviewed  CULTURE, GROUP A STREP [facility])  POCT RAPID STREP A, ED / UC  CYTOLOGY, (ORAL, ANAL, URETHRAL) ANCILLARY  ONLY    EKG   Radiology No results found.  Procedures Procedures (including critical care time)  Medications Ordered in UC Medications  cefTRIAXone (ROCEPHIN) injection 500 mg (500 mg Intramuscular Given 04/13/20 0950)    Initial Impression / Assessment and Plan / UC Course  I have reviewed the triage vital signs and the nursing notes.  Pertinent labs & imaging results that were available during my care of the patient were reviewed by me and considered in my medical decision making (see chart for details).     Sore throat Rapid strep test negative today.  Culture pending. Patient requesting prophylactic treatment for gonorrhea due to history and concern of recurrence Most likely something viral. We will go ahead and treat today based on concern.  Rocephin injection given here for gonorrhea. Recommend warm salt water gargles and ibuprofen as needed Follow up as needed for continued or worsening symptoms  Final Clinical Impressions(s) / UC Diagnoses   Final diagnoses:  Sore throat     Discharge Instructions     Your strep test was negative. Sending for culture.  Also testing for gonorrhea and chlamydia of the throat. You can check your my chart for results.  Warm salt water gargles to the throat and ibuprofen as needed.  Follow up as needed for continued or worsening symptoms     ED Prescriptions    None     PDMP not reviewed this encounter.   Janace Aris, NP 04/13/20 1059

## 2020-04-13 NOTE — Discharge Instructions (Addendum)
Your strep test was negative. Sending for culture.  Also testing for gonorrhea and chlamydia of the throat. You can check your my chart for results.  Warm salt water gargles to the throat and ibuprofen as needed.  Follow up as needed for continued or worsening symptoms

## 2020-04-13 NOTE — ED Triage Notes (Signed)
Sore throat started yesterday. History of gonorrhea and chlamydia in throat.

## 2020-04-14 LAB — CYTOLOGY, (ORAL, ANAL, URETHRAL) ANCILLARY ONLY
Chlamydia: NEGATIVE
Comment: NEGATIVE
Comment: NEGATIVE
Comment: NORMAL
Neisseria Gonorrhea: POSITIVE — AB
Trichomonas: NEGATIVE

## 2020-04-15 LAB — CULTURE, GROUP A STREP (THRC)

## 2020-04-29 ENCOUNTER — Encounter (HOSPITAL_COMMUNITY): Payer: Self-pay

## 2020-04-29 ENCOUNTER — Emergency Department (HOSPITAL_COMMUNITY)
Admission: EM | Admit: 2020-04-29 | Discharge: 2020-04-29 | Discharge status: Against Medical Advice | Payer: Medicaid Other | Attending: Emergency Medicine | Admitting: Emergency Medicine

## 2020-04-29 DIAGNOSIS — I10 Essential (primary) hypertension: Secondary | ICD-10-CM | POA: Insufficient documentation

## 2020-04-29 DIAGNOSIS — Z202 Contact with and (suspected) exposure to infections with a predominantly sexual mode of transmission: Secondary | ICD-10-CM | POA: Insufficient documentation

## 2020-04-29 DIAGNOSIS — Z7982 Long term (current) use of aspirin: Secondary | ICD-10-CM | POA: Insufficient documentation

## 2020-04-29 DIAGNOSIS — Z79899 Other long term (current) drug therapy: Secondary | ICD-10-CM | POA: Diagnosis not present

## 2020-04-29 DIAGNOSIS — Z8616 Personal history of COVID-19: Secondary | ICD-10-CM | POA: Diagnosis not present

## 2020-04-29 DIAGNOSIS — J029 Acute pharyngitis, unspecified: Secondary | ICD-10-CM | POA: Diagnosis not present

## 2020-04-29 DIAGNOSIS — J45909 Unspecified asthma, uncomplicated: Secondary | ICD-10-CM | POA: Diagnosis not present

## 2020-04-29 LAB — URINALYSIS, ROUTINE W REFLEX MICROSCOPIC
Bacteria, UA: NONE SEEN
Bilirubin Urine: NEGATIVE
Glucose, UA: NEGATIVE mg/dL
Hgb urine dipstick: NEGATIVE
Ketones, ur: 5 mg/dL — AB
Leukocytes,Ua: NEGATIVE
Nitrite: NEGATIVE
Protein, ur: 30 mg/dL — AB
Specific Gravity, Urine: 1.029 (ref 1.005–1.030)
pH: 5 (ref 5.0–8.0)

## 2020-04-29 LAB — GROUP A STREP BY PCR: Group A Strep by PCR: NOT DETECTED

## 2020-04-29 LAB — WET PREP, GENITAL
Clue Cells Wet Prep HPF POC: NONE SEEN
Sperm: NONE SEEN
Trich, Wet Prep: NONE SEEN
Yeast Wet Prep HPF POC: NONE SEEN

## 2020-04-29 LAB — HIV ANTIBODY (ROUTINE TESTING W REFLEX): HIV Screen 4th Generation wRfx: NONREACTIVE

## 2020-04-29 LAB — PREGNANCY, URINE: Preg Test, Ur: NEGATIVE

## 2020-04-29 MED ORDER — DOXYCYCLINE HYCLATE 100 MG PO CAPS: 100.0000 mg | ORAL_CAPSULE | Freq: Two times a day (BID) | ORAL | 0 refills | Status: DC

## 2020-04-29 MED ORDER — CEFTRIAXONE SODIUM 500 MG IJ SOLR: 500.0000 mg | Freq: Once | INTRAMUSCULAR | Status: DC

## 2020-04-29 MED ORDER — LIDOCAINE HCL (PF) 1 % IJ SOLN: 1.0000 mL | Freq: Once | INTRAMUSCULAR | Status: DC

## 2020-04-29 NOTE — Discharge Instructions (Addendum)
You were tested and treated today for gonorrhea and chlamydia. You will need to take doxycycline twice daily for the next week to complete Chlamydia treatment.You were also tested for HIV and syphilis. Test results typically take 2-3 days to come back, and you will be called by phone with any positive results.  Negative results can also be viewed online through your MyChart account. Avoid sexual activity until you have completed all antibiotics and any symptoms have resolved. Please notify any partners so that they can be tested and treated as well. Please make sure you are using protection when sexually active.  You can go to the health department for any future STD testing needs.

## 2020-04-29 NOTE — ED Triage Notes (Signed)
Pt arrives to ED requesting STD check. Pt reports sore throat, concerned about infection in her throat.

## 2020-04-29 NOTE — ED Notes (Signed)
Pt given water, per Adelina Mings - PA. Pt also given a urine cup to provide a sample.

## 2020-04-29 NOTE — ED Provider Notes (Signed)
[facility] EMERGENCY DEPARTMENT Provider Note   [name]: [id number] Arrival date & time: 04/29/20  1339     History Chief Complaint  Patient presents with  . Exposure to STD    Tricia Ray is a 29 y.o. female.  Tricia Ray is a 29 y.o. female with a history of hypertension, asthma, PCOS and obesity, who presents to the emergency department with concern for STD exposure.  Patient reports that she was recently told by one of her partners that they tested positive for gonorrhea and chlamydia.  She was last sexually active with this partner last week, states that they had protected vaginal sex but unprotected oral sex.  She has noted a sore throat over the past few days with some white spots on her tonsils, no associated fever or chills, no rhinorrhea or cough.  She has not noted any vaginal discharge, vaginal bleeding, dysuria, or pelvic pain.  Denies any abdominal pain, nausea or vomiting.  Was very worried that she had been exposed and states that today was her only day off available to come in for evaluation.        Past Medical History:  Diagnosis Date  . Anemia   . Asthma   . Gestational diabetes   . Hypertension    pregnancy induced  . Morbid obesity (HCC)   . PCOS (polycystic ovarian syndrome)   . Seasonal allergies     Patient Active Problem List   Diagnosis Date Noted  . Diabetes in pregnancy 06/21/2019  . Chronic hypertension 06/21/2019  . COVID-19 virus infection 06/21/2019  . Severe pre-eclampsia 06/21/2019  . Low vitamin D level 05/25/2019  . BMI 50.0-59.9, adult (HCC) 05/21/2019  . LGA (large for gestational age) fetus affecting management of mother 05/21/2019  . Chronic hypertension during pregnancy 05/13/2019  . Obesity affecting pregnancy 05/09/2019  . Supervision of high risk pregnancy, antepartum 04/23/2019  . GDM (gestational diabetes mellitus) 04/23/2019  . UTI (urinary tract infection) during pregnancy 04/23/2019  . Asthma   .  PCOS (polycystic ovarian syndrome)     Past Surgical History:  Procedure Laterality Date  . CESAREAN SECTION N/A 06/23/2019   Procedure: CESAREAN SECTION;  Surgeon: Adam Phenix, MD;  Location: [facility] LD ORS;  Service: Obstetrics;  Laterality: N/A;  . HAND SURGERY       OB History    Gravida  1   Para  1   Term  1   Preterm      AB      Living  1     SAB      TAB      Ectopic      Multiple  0   Live Births  1           Family History  Problem Relation Age of Onset  . Cancer Mother   . Leukemia Mother   . Diabetes Mother     Social History   Tobacco Use  . Smoking status: Never Smoker  . Smokeless tobacco: Never Used  Vaping Use  . Vaping Use: Never used  Substance Use Topics  . Alcohol use: Not Currently  . Drug use: Yes    Types: Marijuana    Home Medications Prior to Admission medications   Medication Sig Start Date End Date Taking? Authorizing Provider  Accu-Chek Softclix Lancets lancets Use as instructed 05/07/19   Fair, Hoyle Sauer, MD  amLODipine (NORVASC) 10 MG tablet Take 1 tablet (10 mg total) by mouth daily.  06/26/19   Reva Bores, MD  aspirin EC 81 MG tablet Take 1 tablet (81 mg total) by mouth daily. Take after 12 weeks for prevention of preeclampsia later in pregnancy 05/13/19   Levie Heritage, DO  doxycycline (VIBRAMYCIN) 100 MG capsule Take 1 capsule (100 mg total) by mouth 2 (two) times daily. 04/29/20   Dartha Lodge, PA-C  ferrous sulfate 325 (65 FE) MG tablet Take 1 tablet (325 mg total) by mouth daily with breakfast. 04/29/19   Fair, Hoyle Sauer, MD  glucose blood (ACCU-CHEK GUIDE) test strip Use as instructed 04/29/19   Fair, Hoyle Sauer, MD  hydrochlorothiazide (HYDRODIURIL) 25 MG tablet Take 1 tablet (25 mg total) by mouth daily. 06/26/19   Reva Bores, MD  Insulin Syringe-Needle U-100 (INSULIN SYRINGE 1CC/31GX5/16") 31G X 5/16" 1 ML MISC 1 Device by Does not apply route 4 (four) times daily. 05/26/19   Fair, Hoyle Sauer, MD    labetalol (NORMODYNE) 200 MG tablet Take 3 tablets (600 mg total) by mouth 2 (two) times daily. 06/11/19   Adam Phenix, MD  metFORMIN (GLUCOPHAGE) 500 MG tablet Take 1 tablet at bedtime nightly 06/04/19   Dove, Myra C, MD  norethindrone (MICRONOR) 0.35 MG tablet Take 1 tablet (0.35 mg total) by mouth daily. Begin 4-6 weeks after birth 06/25/19   Reva Bores, MD  Prenatal Vit-Fe Fumarate-FA (PRENATAL MULTIVITAMIN) TABS tablet Take 1 tablet by mouth daily at 12 noon.    [provider]  Vitamin D, Ergocalciferol, (DRISDOL) 1.25 MG (50000 UNIT) CAPS capsule Take 1 capsule by mouth once a week 06/27/19   [place] Bing, MD    Allergies    Shrimp [shellfish allergy], Aleve [naproxen sodium], Cherry, and Penicillins  Review of Systems   Review of Systems  Constitutional: Negative for chills and fever.  HENT: Positive for sore throat. Negative for congestion and rhinorrhea.   Respiratory: Negative for cough and shortness of breath.   Cardiovascular: Negative for chest pain.  Gastrointestinal: Negative for abdominal pain, nausea and vomiting.  Genitourinary: Negative for dysuria, frequency, genital sores, pelvic pain, vaginal bleeding and vaginal discharge.  Skin: Negative for rash.    Physical Exam Updated Vital Signs BP (!) 146/91   Pulse 82   Temp 98.7 F (37.1 C) (Oral)   Resp 16   Ht 5\' 7"  (1.702 m)   Wt 122.5 kg   LMP 04/10/2020   SpO2 100%   BMI 42.29 kg/m   Physical Exam Vitals and nursing note reviewed.  Constitutional:      General: She is not in acute distress.    Appearance: Normal appearance. She is well-developed. She is obese. She is not diaphoretic.     Comments: Well-appearing and in no distress   HENT:     Head: Normocephalic and atraumatic.     Mouth/Throat:     Mouth: Mucous membranes are moist.     Pharynx: Oropharyngeal exudate and posterior oropharyngeal erythema present.     Comments: Mucous membranes moist, posterior oropharynx is  erythematous with bilateral 2+ tonsillar edema noted, and some exudates noted to bilateral tonsils, uvula is midline without swelling, patient without trismus, normal phonation, tolerating secretions Eyes:     General:        Right eye: No discharge.        Left eye: No discharge.  Neck:     Comments: No stridor, no cervical lymphadenopathy, no fullness or masses Cardiovascular:     Rate and Rhythm: Normal  rate and regular rhythm.     Heart sounds: Normal heart sounds.  Pulmonary:     Effort: Pulmonary effort is normal. No respiratory distress.     Breath sounds: Normal breath sounds.     Comments: Respirations equal and unlabored, patient able to speak in full sentences, lungs clear to auscultation bilaterally Abdominal:     General: Bowel sounds are normal. There is no distension.     Palpations: Abdomen is soft. There is no mass.     Tenderness: There is no abdominal tenderness. There is no guarding.     Comments: Abdomen soft, nondistended, nontender to palpation in all quadrants without guarding or peritoneal signs   Musculoskeletal:        General: No deformity.     Cervical back: Neck supple.  Skin:    General: Skin is warm and dry.     Findings: No rash.  Neurological:     Mental Status: She is alert and oriented to person, place, and time.     Coordination: Coordination normal.  Psychiatric:        Mood and Affect: Mood normal.        Behavior: Behavior normal.     ED Results / Procedures / Treatments   Labs (all labs ordered are listed, but only abnormal results are displayed) Labs Reviewed  WET PREP, GENITAL - Abnormal; Notable for the following components:      Result Value   WBC, Wet Prep HPF POC MODERATE (*)    All other components within normal limits  URINALYSIS, ROUTINE W REFLEX MICROSCOPIC - Abnormal; Notable for the following components:   Ketones, ur 5 (*)    Protein, ur 30 (*)    All other components within normal limits  GROUP A STREP BY PCR  HIV  ANTIBODY (ROUTINE TESTING W REFLEX)  PREGNANCY, URINE  RPR  GC/CHLAMYDIA PROBE AMP ([place]) NOT AT [facility]    EKG None  Radiology No results found.  Procedures Procedures (including critical care time)  Medications Ordered in ED Medications  cefTRIAXone (ROCEPHIN) injection 500 mg (has no administration in time range)  lidocaine (PF) (XYLOCAINE) 1 % injection 1 mL (has no administration in time range)    ED Course  I have reviewed the triage vital signs and the nursing notes.  Pertinent labs & imaging results that were available during my care of the patient were reviewed by me and considered in my medical decision making (see chart for details).    MDM Rules/Calculators/A&P                          29 year old female presents to the ED with STD exposure, was recently told that one of her sexual partners had tested positive for both gonorrhea and chlamydia.  Patient states that she has had a sore throat over the past 3 days and is concerned that this could be related, but otherwise has not experienced any symptoms, no vaginal discharge, dysuria, lower abdominal or pelvic pain, no fevers or chills.  No rash or skin changes, no genital lesions.  Given that patient is not having any pelvic symptoms we will have her perform self swabs, she does have some erythema and swelling of the tonsils, will test for strep as well.  We will plan to treat patient prophylactically with Rocephin and doxycycline prescription.  Patient's pregnancy is negative, her wet prep does have a moderate amount of WBCs concerning for possible infection.  Her strep test was negative today.  Sore throat could be viral versus gonorrhea infection.  Went in to discuss test results with patient, and had ordered Rocephin for gonorrhea treatment, but patient was nowhere to be found, I called and spoke with the patient, and she reports that she left because she had to be at work shortly.  I have encouraged her to  follow-up with urgent care or the health department to get Rocephin shot, and I sent doxycycline prescription into her pharmacy so that she can pick this up tomorrow.  Patient left before receiving any discharge paperwork or instructions.  Final Clinical Impression(s) / ED Diagnoses Final diagnoses:  STD exposure  Sore throat    Rx / DC Orders ED Discharge Orders         Ordered    doxycycline (VIBRAMYCIN) 100 MG capsule  2 times daily        04/29/20 1731           Dartha LodgeFord, Ezinne Yogi N, [place]PA-C 04/29/20 1736    Pricilla LovelessGoldston, Scott, MD 04/30/20 1146

## 2020-04-29 NOTE — ED Notes (Signed)
Pt notified staff that she is leaving because she has to go to work.

## 2020-04-29 NOTE — ED Notes (Signed)
Pt reported to other staff that she has to be at work at 1800 and is going to have to leave. Pt is not in room and has left.

## 2020-04-30 ENCOUNTER — Ambulatory Visit (HOSPITAL_COMMUNITY)
Admission: EM | Admit: 2020-04-30 | Discharge: 2020-04-30 | Disposition: A | Discharge status: Home / Self Care | Payer: Medicaid Other | Attending: Physician Assistant | Admitting: Physician Assistant

## 2020-04-30 ENCOUNTER — Other Ambulatory Visit: Payer: Self-pay

## 2020-04-30 ENCOUNTER — Encounter (HOSPITAL_COMMUNITY): Payer: Self-pay | Admitting: *Deleted

## 2020-04-30 DIAGNOSIS — Z202 Contact with and (suspected) exposure to infections with a predominantly sexual mode of transmission: Secondary | ICD-10-CM

## 2020-04-30 HISTORY — DX: Gonococcal infection, unspecified: A54.9

## 2020-04-30 LAB — RPR: RPR Ser Ql: NONREACTIVE

## 2020-04-30 MED ORDER — CEFTRIAXONE SODIUM 500 MG IJ SOLR
INTRAMUSCULAR | Status: AC
  Filled 2020-04-30: qty 500

## 2020-04-30 MED ORDER — CEFTRIAXONE SODIUM 500 MG IJ SOLR
500.0000 mg | Freq: Once | INTRAMUSCULAR | Status: AC
  Administered 2020-04-30: 500 mg via INTRAMUSCULAR

## 2020-04-30 MED ORDER — LIDOCAINE HCL (PF) 1 % IJ SOLN
INTRAMUSCULAR | Status: AC
  Filled 2020-04-30: qty 2

## 2020-04-30 NOTE — ED Provider Notes (Signed)
[facility]    [name]: [id number] Arrival date & time: 04/30/20  9407      History   Chief Complaint Chief Complaint  Patient presents with  . STD Exposure    HPI Tricia Ray is a 29 y.o. female.   Pt seen in ED yesterday, treated empirically for gonorrhea and chlamydia. Pt left before receiving rocephin shot in ED, doxycycline sent to pharmacy.  She was advised to follow up in UC for rocephin shot.  No changes since yesterdays visit.  She will pick up the doxycycline today, pharmacy was closed yesterday.      Past Medical History:  Diagnosis Date  . Anemia   . Asthma   . Gestational diabetes   . Gonorrhea   . Hypertension    pregnancy induced  . Morbid obesity (HCC)   . PCOS (polycystic ovarian syndrome)   . Seasonal allergies     Patient Active Problem List   Diagnosis Date Noted  . Diabetes in pregnancy 06/21/2019  . Chronic hypertension 06/21/2019  . COVID-19 virus infection 06/21/2019  . Severe pre-eclampsia 06/21/2019  . Low vitamin D level 05/25/2019  . BMI 50.0-59.9, adult (HCC) 05/21/2019  . LGA (large for gestational age) fetus affecting management of mother 05/21/2019  . Chronic hypertension during pregnancy 05/13/2019  . Obesity affecting pregnancy 05/09/2019  . Supervision of high risk pregnancy, antepartum 04/23/2019  . GDM (gestational diabetes mellitus) 04/23/2019  . UTI (urinary tract infection) during pregnancy 04/23/2019  . Asthma   . PCOS (polycystic ovarian syndrome)     Past Surgical History:  Procedure Laterality Date  . CESAREAN SECTION N/A 06/23/2019   Procedure: CESAREAN SECTION;  Surgeon: Adam Phenix, MD;  Location: [facility] LD ORS;  Service: Obstetrics;  Laterality: N/A;  . HAND SURGERY      OB History    Gravida  1   Para  1   Term  1   Preterm      AB      Living  1     SAB      TAB      Ectopic      Multiple  0   Live Births  1            Home Medications    Prior to Admission  medications   Medication Sig Start Date End Date Taking? Authorizing Provider  Accu-Chek Softclix Lancets lancets Use as instructed 05/07/19   Fair, Hoyle Sauer, MD  amLODipine (NORVASC) 10 MG tablet Take 1 tablet (10 mg total) by mouth daily. 06/26/19   Reva Bores, MD  aspirin EC 81 MG tablet Take 1 tablet (81 mg total) by mouth daily. Take after 12 weeks for prevention of preeclampsia later in pregnancy 05/13/19   Levie Heritage, DO  doxycycline (VIBRAMYCIN) 100 MG capsule Take 1 capsule (100 mg total) by mouth 2 (two) times daily. 04/29/20   Dartha Lodge, PA-C  ferrous sulfate 325 (65 FE) MG tablet Take 1 tablet (325 mg total) by mouth daily with breakfast. 04/29/19   Fair, Hoyle Sauer, MD  glucose blood (ACCU-CHEK GUIDE) test strip Use as instructed 04/29/19   Fair, Hoyle Sauer, MD  hydrochlorothiazide (HYDRODIURIL) 25 MG tablet Take 1 tablet (25 mg total) by mouth daily. 06/26/19   Reva Bores, MD  Insulin Syringe-Needle U-100 (INSULIN SYRINGE 1CC/31GX5/16") 31G X 5/16" 1 ML MISC 1 Device by Does not apply route 4 (four) times daily. 05/26/19   Fair, Hoyle Sauer,  MD  labetalol (NORMODYNE) 200 MG tablet Take 3 tablets (600 mg total) by mouth 2 (two) times daily. 06/11/19   Adam Phenix, MD  metFORMIN (GLUCOPHAGE) 500 MG tablet Take 1 tablet at bedtime nightly 06/04/19   Dove, Myra C, MD  norethindrone (MICRONOR) 0.35 MG tablet Take 1 tablet (0.35 mg total) by mouth daily. Begin 4-6 weeks after birth 06/25/19   Reva Bores, MD  Prenatal Vit-Fe Fumarate-FA (PRENATAL MULTIVITAMIN) TABS tablet Take 1 tablet by mouth daily at 12 noon.    [provider]  Vitamin D, Ergocalciferol, (DRISDOL) 1.25 MG (50000 UNIT) CAPS capsule Take 1 capsule by mouth once a week 06/27/19   [place] Bing, MD    Family History Family History  Problem Relation Age of Onset  . Cancer Mother   . Leukemia Mother   . Diabetes Mother     Social History Social History   Tobacco Use  . Smoking status:  Never Smoker  . Smokeless tobacco: Never Used  Vaping Use  . Vaping Use: Never used  Substance Use Topics  . Alcohol use: Not Currently  . Drug use: Yes    Types: Marijuana     Allergies   Shrimp [shellfish allergy], Aleve [naproxen sodium], Cherry, and Penicillins   Review of Systems Review of Systems  Constitutional: Negative for chills and fever.  HENT: Negative for ear pain and sore throat.   Eyes: Negative for pain and visual disturbance.  Respiratory: Negative for cough and shortness of breath.   Cardiovascular: Negative for chest pain and palpitations.  Gastrointestinal: Negative for abdominal pain and vomiting.  Genitourinary: Negative for dysuria and hematuria.  Musculoskeletal: Negative for arthralgias and back pain.  Skin: Negative for color change and rash.  Neurological: Negative for seizures and syncope.  All other systems reviewed and are negative.    Physical Exam Triage Vital Signs ED Triage Vitals  Enc Vitals Group     BP 04/30/20 0910 139/81     Pulse Rate 04/30/20 0910 (!) 105     Resp 04/30/20 0910 16     Temp 04/30/20 0910 98.1 F (36.7 C)     Temp Source 04/30/20 0910 Oral     SpO2 04/30/20 0910 98 %     Weight --      Height --      Head Circumference --      Peak Flow --      Pain Score 04/30/20 0911 0     Pain Loc --      Pain Edu? --      Excl. in GC? --    No data found.  Updated Vital Signs BP 139/81   Pulse (!) 105   Temp 98.1 F (36.7 C) (Oral)   Resp 16   LMP 04/10/2020 (Exact Date)   SpO2 98%   Breastfeeding No   Visual Acuity Right Eye Distance:   Left Eye Distance:   Bilateral Distance:    Right Eye Near:   Left Eye Near:    Bilateral Near:     Physical Exam Vitals and nursing note reviewed.  Constitutional:      General: She is not in acute distress.    Appearance: She is well-developed.  HENT:     Head: Normocephalic and atraumatic.  Eyes:     Conjunctiva/sclera: Conjunctivae normal.  Cardiovascular:      Rate and Rhythm: Normal rate and regular rhythm.     Heart sounds: No murmur heard.   Pulmonary:  Effort: Pulmonary effort is normal. No respiratory distress.     Breath sounds: Normal breath sounds.  Abdominal:     Palpations: Abdomen is soft.     Tenderness: There is no abdominal tenderness.  Musculoskeletal:     Cervical back: Neck supple.  Skin:    General: Skin is warm and dry.  Neurological:     Mental Status: She is alert.      UC Treatments / Results  Labs (all labs ordered are listed, but only abnormal results are displayed) Labs Reviewed - No data to display  EKG   Radiology No results found.  Procedures Procedures (including critical care time)  Medications Ordered in UC Medications  cefTRIAXone (ROCEPHIN) injection 500 mg (has no administration in time range)    Initial Impression / Assessment and Plan / UC Course  I have reviewed the triage vital signs and the nursing notes.  Pertinent labs & imaging results that were available during my care of the patient were reviewed by me and considered in my medical decision making (see chart for details).     Pt followed up for rocephin shot after leaving ED before receiving today.  Will pick up doxycycline.  Final Clinical Impressions(s) / UC Diagnoses   Final diagnoses:  None   Discharge Instructions   None    ED Prescriptions    None     PDMP not reviewed this encounter.   Jodell Cipro, PA-C 04/30/20 1010

## 2020-04-30 NOTE — ED Triage Notes (Signed)
Pt reports having gonorrhea throat infection a couple weeks ago; states received treatment, but feels she engaged in sexual activity "too soon", states she feels she may still have an active throat infection.  States was seen in ED yesterday and had throat and vaginal swab, along with blood work, but left due to wait.  Presents to [facility] requesting shot for treatment of suspected gonorrhea throat infection.

## 2020-05-05 LAB — GC/CHLAMYDIA PROBE AMP (~~LOC~~) NOT AT ARMC
Chlamydia: NEGATIVE
Comment: NEGATIVE

## 2020-08-06 ENCOUNTER — Other Ambulatory Visit: Payer: Self-pay

## 2020-08-06 ENCOUNTER — Encounter (HOSPITAL_COMMUNITY): Payer: Self-pay

## 2020-08-06 ENCOUNTER — Ambulatory Visit (HOSPITAL_COMMUNITY)
Admission: EM | Admit: 2020-08-06 | Discharge: 2020-08-06 | Disposition: A | Discharge status: Home / Self Care | Payer: Medicaid Other | Attending: Emergency Medicine | Admitting: Emergency Medicine

## 2020-08-06 DIAGNOSIS — S39012A Strain of muscle, fascia and tendon of lower back, initial encounter: Secondary | ICD-10-CM

## 2020-08-06 DIAGNOSIS — M5431 Sciatica, right side: Secondary | ICD-10-CM

## 2020-08-06 MED ORDER — IBUPROFEN 800 MG PO TABS: 800.0000 mg | ORAL_TABLET | Freq: Three times a day (TID) | ORAL | 0 refills | Status: DC | PRN

## 2020-08-06 MED ORDER — METHYLPREDNISOLONE ACETATE 80 MG/ML IJ SUSP
80.0000 mg | Freq: Once | INTRAMUSCULAR | Status: AC
  Administered 2020-08-06: 80 mg via INTRAMUSCULAR

## 2020-08-06 MED ORDER — METHYLPREDNISOLONE ACETATE 80 MG/ML IJ SUSP
INTRAMUSCULAR | Status: AC
  Filled 2020-08-06: qty 1

## 2020-08-06 MED ORDER — TIZANIDINE HCL 2 MG PO TABS
2.0000 mg | ORAL_TABLET | Freq: Every day | ORAL | 0 refills | Status: DC
Start: 2020-08-06 — End: 2022-03-18
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
## 2020-08-06 NOTE — Discharge Instructions (Addendum)
Light and regular activity as tolerated.  See exercises provided.  Heat application while active can help with muscle spasms.  Sleep with pillow under your knees.   I am hopeful the injection we give today is helpful in decreasing pain.  I have sent a higher dose of ibuprofen which you can use every 8 hours as needed for pain, take with food (noted your naproxen allergy) Tizanidine as a muscle relaxer as needed at bedtime.  Follow up with your PCP and/or sports medicine as needed if symptoms persist.
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
## 2021-01-14 IMAGING — US US MFM OB DETAIL+14 WK
1 series · 13 of 28 positions shown · non-contrast
Comparison: none

[Series 1: us mfm ob detail+14 wk · 74 acquisitions, 13 frames shown]
[im 3/74]
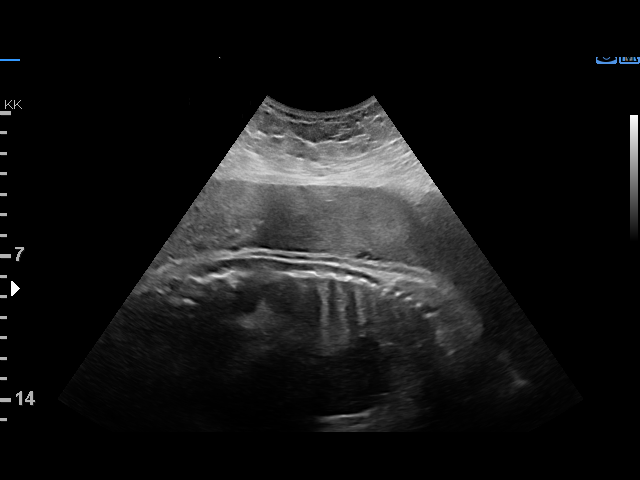
[im 9/74]
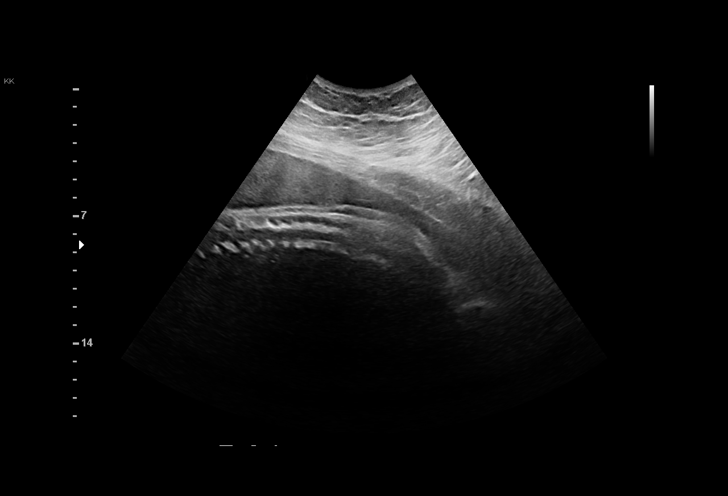
[im 14/74]
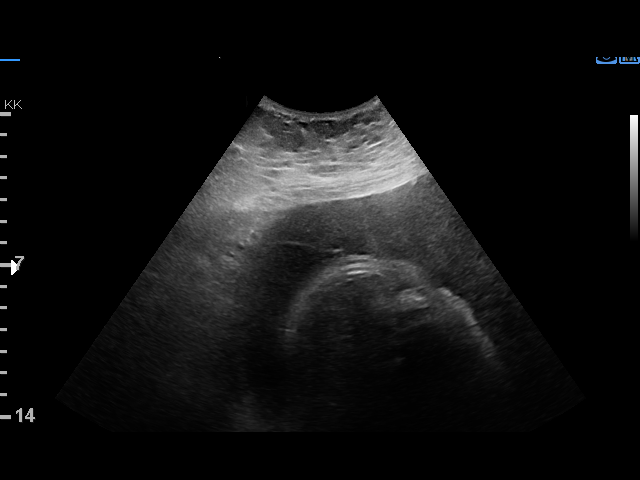
[im 19/74]
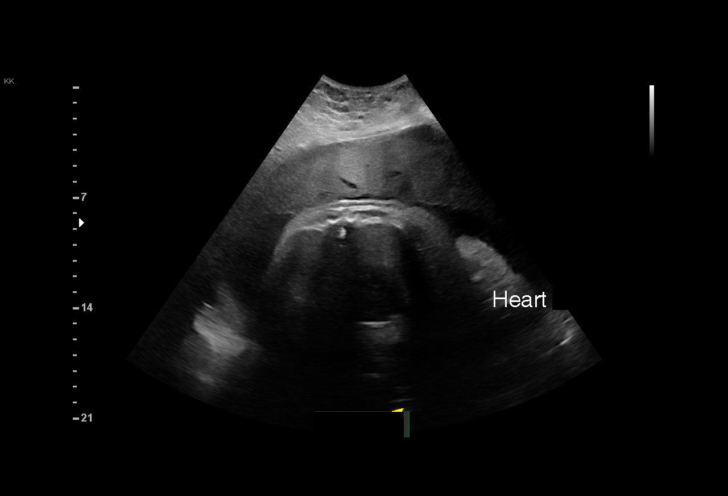
[im 25/74]
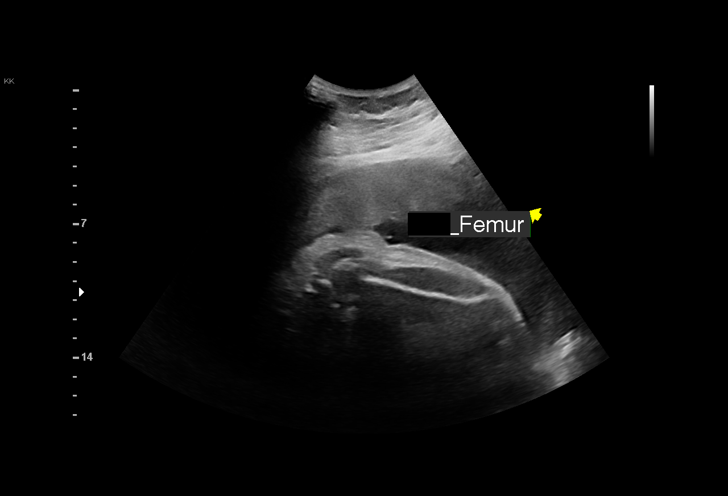
[im 30/74]
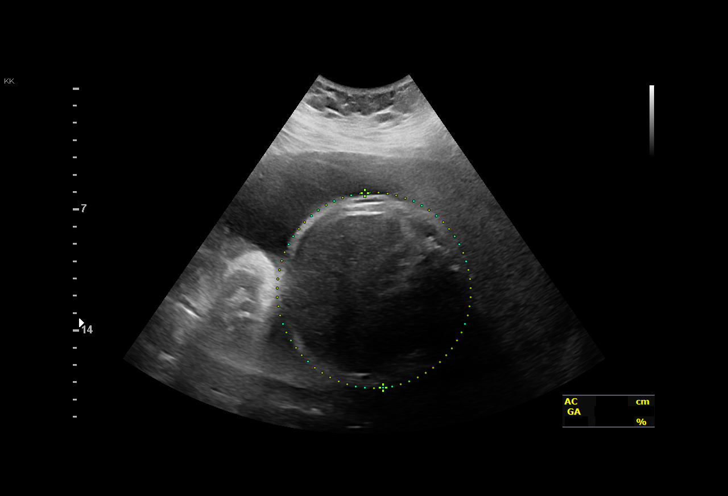
[im 38/74]
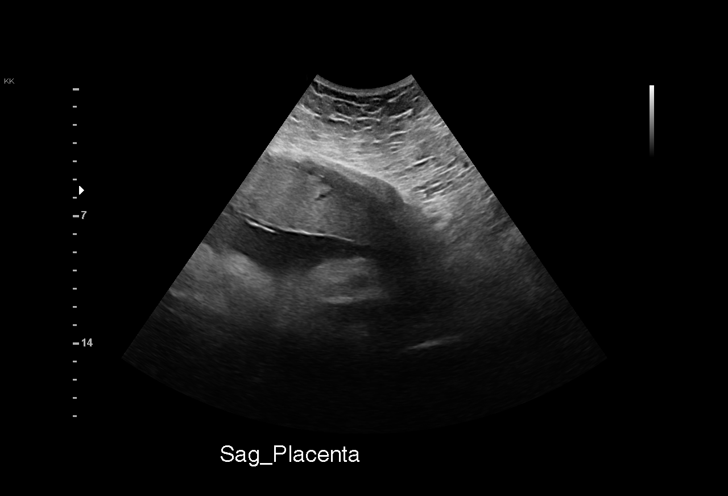
[im 44/74]
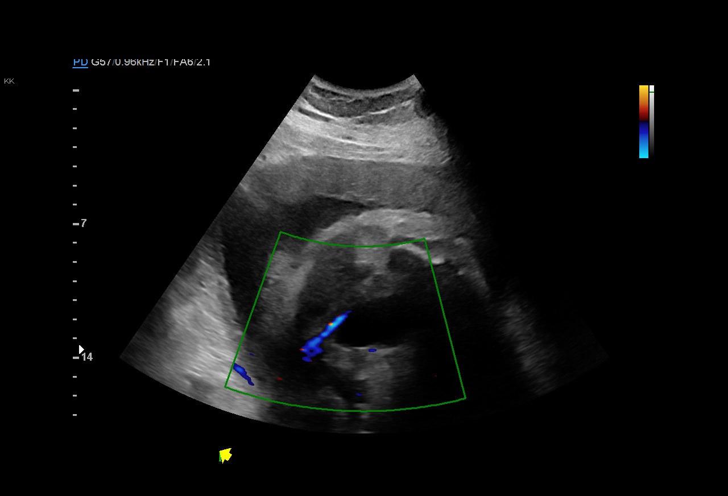
[im 49/74]
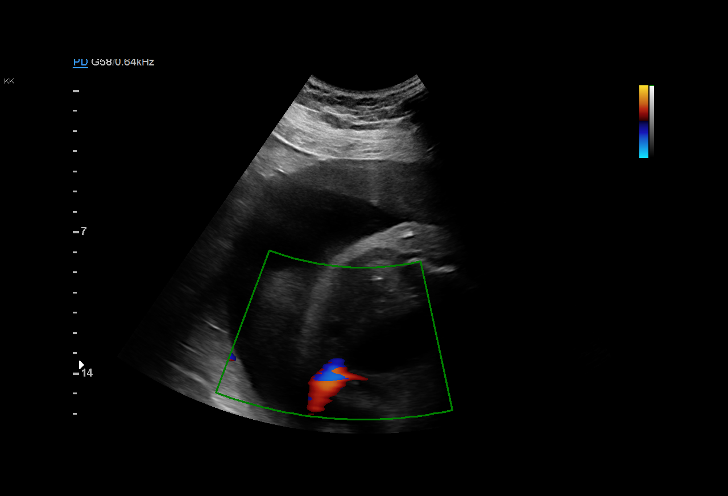
[im 55/74]
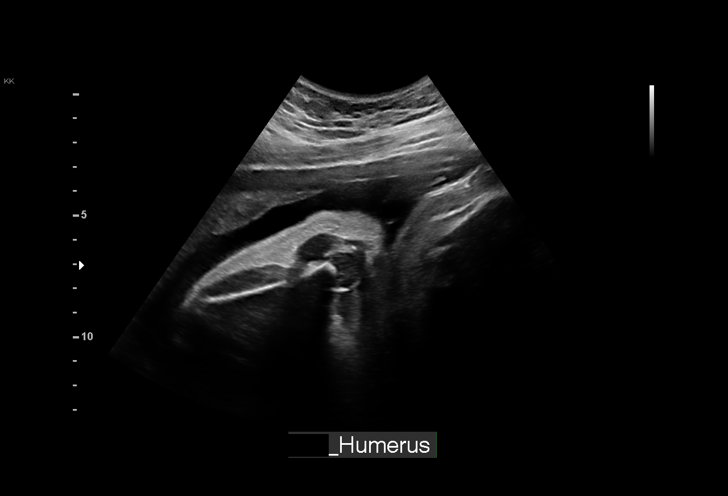
[im 60/74]
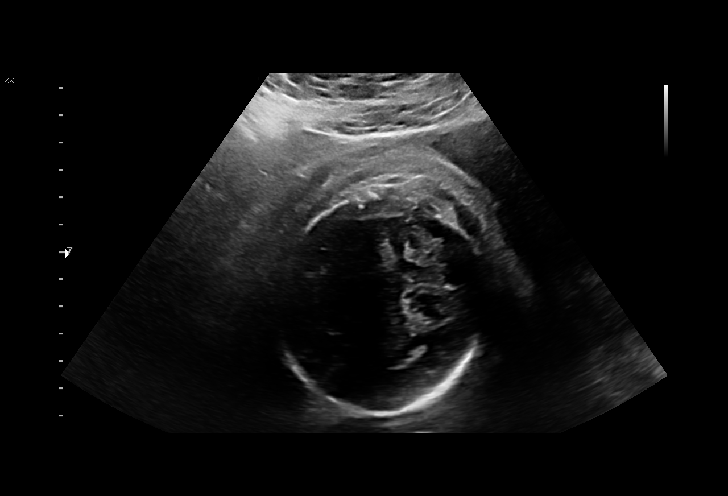
[im 65/74]
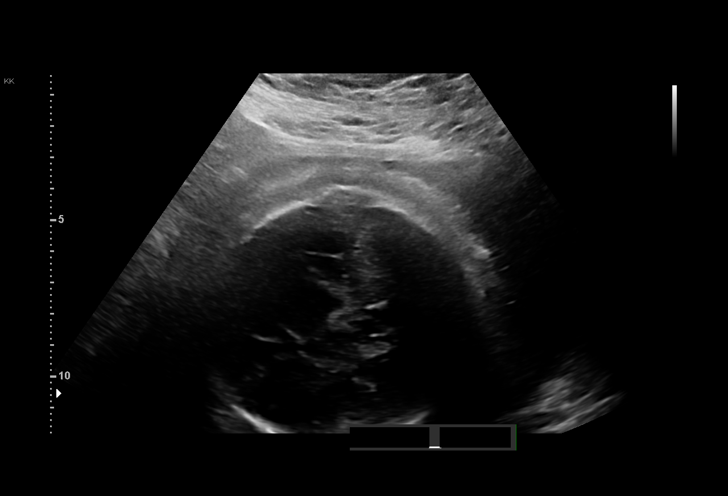
[im 71/74]
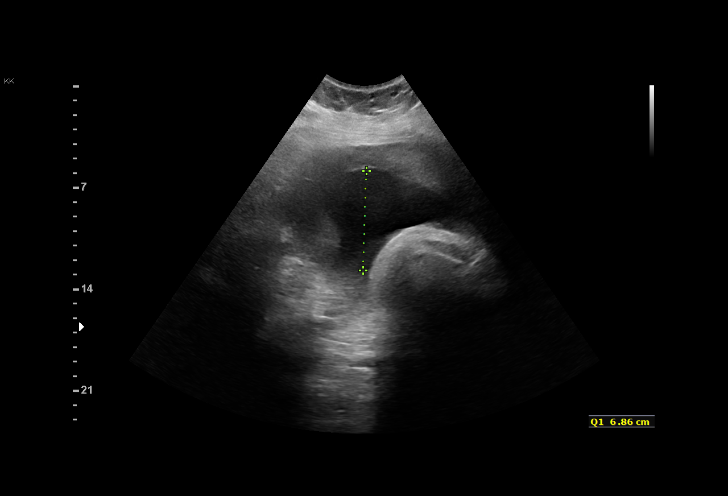

[13 of 28 positions shown; findings below may reference images not displayed]

----------------------------------------------------------------------

 ----------------------------------------------------------------------
Indications

  31 weeks gestation of pregnancy
  Hypertension - Chronic/Pre-existing
  (Labetalol)
  Gestational diabetes in pregnancy, diet
  controlled
  Obesity complicating pregnancy, third
  trimester
 ----------------------------------------------------------------------
Vital Signs

 BMI:
Fetal Evaluation

 Num Of Fetuses:         1
 Cardiac Activity:       Observed
 Presentation:           Cephalic
 Placenta:               Anterior
 P. Cord Insertion:      Not well visualized

 Amniotic Fluid
 AFI FV:      Within normal limits

 AFI Sum(cm)     %Tile       Largest Pocket(cm)
 23.32           91

 RUQ(cm)       RLQ(cm)       LUQ(cm)        LLQ(cm)

Biometry

 BPD:      78.1  mm     G. Age:  31w 2d         25  %    CI:        77.52   %    70 - 86
                                                         FL/HC:      23.6   %    19.1 -
 HC:      280.8  mm     G. Age:  30w 5d          3  %    HC/AC:      0.86        0.96 -
 AC:      326.4  mm     G. Age:  36w 4d       > 99  %    FL/BPD:     84.8   %    71 - 87
 FL:       66.2  mm     G. Age:  34w 1d         90  %    FL/AC:      20.3   %    20 - 24

 Est. FW:    2192  gm      5 lb 8 oz     99  %
OB History

 Gravidity:    1
Gestational Age

 LMP:           33w 1d        Date:  09/26/18                 EDD:   07/03/19
 U/S Today:     33w 1d                                        EDD:   07/03/19
 Best:          31w 6d     Det. By:  Early Ultrasound         EDD:   07/12/19
                                     (12/26/18)
Anatomy

 Cranium:               Appears normal         LVOT:                   Not well visualized
 Cavum:                 Appears normal         Aortic Arch:            Not well visualized
 Ventricles:            Appears normal         Ductal Arch:            Not well visualized
 Choroid Plexus:        Not well visualized    Diaphragm:              Not well visualized
 Cerebellum:            Appears normal         Stomach:                Appears normal, left
                                                                       sided
 Posterior Fossa:       Not well visualized    Abdomen:                Appears normal
 Nuchal Fold:           Not applicable (>20    Abdominal Wall:         Not well visualized
                        wks GA)
 Face:                  Not well visualized    Cord Vessels:           Appears normal (3
                                                                       vessel cord)
 Lips:                  Not well visualized    Kidneys:                Appear normal
 Palate:                Not well visualized    Bladder:                Appears normal
 Thoracic:              Appears normal         Spine:                  Limited views
                                                                       appear normal
 Heart:                 Not well visualized    Upper Extremities:      Visualized
 RVOT:                  Not well visualized    Lower Extremities:      Visualized

 Other:  Technically difficult due to maternal habitus and fetal position.
         Technically difficult due to advanced gestational age.
Cervix Uterus Adnexa

 Cervix
 Not visualized (advanced GA >38wks)
Impression

 Patient with infrequent prenatal care is here for ultrasound
 assessment.  She has gestational diabetes and had swelling
 on 05/13/2019 by our registered dietitian.  Patient reports she
 start checking her blood glucose.  Today, the fasting level
 was 100 mg/dL.  Overall, she reports her fasting levels are
 below 90-95 and postprandial levels (2-hour) or below 130
 mg/dL.

 On ultrasound, amniotic fluid is normal and good fetal activity
 seen.  The estimated fetal weight is the 99th percentile.
 Abdominal circumference measurement is at greater than the
 99 percentile.  Fetal anatomy was difficult to evaluate
 because of advanced gestational age and maternal obesity.
 No obvious fetal structural defects were seen.
 Measurements were difficult to obtain because of fetal
 position.

 I explained the findings at raises the possibility of suboptimal
 blood glucose control.  Discussed normal blood glucose
 parameters and emphasized the importance of good control
 of diabetes to prevent adverse fetal and neonatal outcomes.

 I also informed her that if diabetes is not well controlled on
 diet, she may require insulin (preferable) or oral
 hypoglycemics.
Recommendations

 -Patient will be returning in 2 weeks for BPP.
 -Weekly BPP from next visit till delivery.
                 Nellikuth, Foyez Ahmed

## 2021-12-19 ENCOUNTER — Ambulatory Visit: Payer: Medicaid Other | Admitting: Nurse Practitioner

## 2022-01-23 ENCOUNTER — Encounter (HOSPITAL_COMMUNITY): Payer: Self-pay

## 2022-01-23 ENCOUNTER — Encounter: Payer: Self-pay | Admitting: Emergency Medicine

## 2022-01-23 ENCOUNTER — Ambulatory Visit (HOSPITAL_COMMUNITY)
Admission: EM | Admit: 2022-01-23 | Discharge: 2022-01-23 | Disposition: A | Discharge status: Home / Self Care | Payer: Medicaid Other | Attending: Physician Assistant | Admitting: Physician Assistant

## 2022-01-23 ENCOUNTER — Ambulatory Visit
Admission: EM | Admit: 2022-01-23 | Discharge: 2022-01-23 | Disposition: A | Discharge status: Home / Self Care | Payer: Medicaid Other | Attending: Internal Medicine | Admitting: Internal Medicine

## 2022-01-23 DIAGNOSIS — J029 Acute pharyngitis, unspecified: Secondary | ICD-10-CM

## 2022-01-23 DIAGNOSIS — J039 Acute tonsillitis, unspecified: Secondary | ICD-10-CM | POA: Diagnosis not present

## 2022-01-23 DIAGNOSIS — Z113 Encounter for screening for infections with a predominantly sexual mode of transmission: Secondary | ICD-10-CM | POA: Insufficient documentation

## 2022-01-23 LAB — POCT INFECTIOUS MONO SCREEN, ED / UC: Mono Screen: NEGATIVE

## 2022-01-23 LAB — POCT RAPID STREP A (OFFICE): Rapid Strep A Screen: NEGATIVE

## 2022-01-23 LAB — POCT RAPID STREP A, ED / UC: Streptococcus, Group A Screen (Direct): NEGATIVE

## 2022-01-23 MED ORDER — CEFTRIAXONE SODIUM 500 MG IJ SOLR
INTRAMUSCULAR | Status: AC
  Filled 2022-01-23: qty 500

## 2022-01-23 MED ORDER — CEFTRIAXONE SODIUM 500 MG IJ SOLR
500.0000 mg | Freq: Once | INTRAMUSCULAR | Status: AC
  Administered 2022-01-23: 500 mg via INTRAMUSCULAR

## 2022-01-23 MED ORDER — AZITHROMYCIN 250 MG PO TABS: ORAL_TABLET | ORAL | 0 refills | Status: DC

## 2022-01-23 NOTE — ED Provider Notes (Addendum)
[name]    [name]: [id number] Arrival date & time: 01/23/22  1411      History   Chief Complaint Chief Complaint  Patient presents with   Sore Throat   SEXUALLY TRANSMITTED DISEASE    HPI Tricia Ray is a 31 y.o. female.   Patient presents with sore throat that started yesterday.  Patient is very concerned for STDs in the throat given that symptoms started after she performed oral sex on her female sexual partner.  She also reports that she performed oral sex about a week prior on a female sexual partner as well.  Patient would like STD testing of the vaginal area as well but denies any confirmed exposure to STD or any associated vaginal discharge, dysuria, pelvic pain, abdominal pain, back pain, fever.  Patient also denies any associated upper respiratory symptoms including nasal congestion, runny nose, cough, fever.  Patient also has elevated blood pressure reading but denies chest pain, shortness of breath, headache, dizziness, blurred vision, nausea, vomiting.   Sore Throat    Past Medical History:  Diagnosis Date   Anemia    Asthma    Gestational diabetes    Gonorrhea    Hypertension    pregnancy induced   Morbid obesity (HCC)    PCOS (polycystic ovarian syndrome)    Seasonal allergies     Patient Active Problem List   Diagnosis Date Noted   Diabetes in pregnancy 06/21/2019   Chronic hypertension 06/21/2019   COVID-19 virus infection 06/21/2019   Severe pre-eclampsia 06/21/2019   Low vitamin D level 05/25/2019   BMI 50.0-59.9, adult (HCC) 05/21/2019   LGA (large for gestational age) fetus affecting management of mother 05/21/2019   Chronic hypertension during pregnancy 05/13/2019   Obesity affecting pregnancy 05/09/2019   Supervision of high risk pregnancy, antepartum 04/23/2019   GDM (gestational diabetes mellitus) 04/23/2019   UTI (urinary tract infection) during pregnancy 04/23/2019   Asthma    PCOS (polycystic ovarian syndrome)      Past Surgical History:  Procedure Laterality Date   CESAREAN SECTION N/A 06/23/2019   Procedure: CESAREAN SECTION;  Surgeon: Adam Phenix, MD;  Location: [facility] LD ORS;  Service: Obstetrics;  Laterality: N/A;   HAND SURGERY      OB History     Gravida  1   Para  1   Term  1   Preterm      AB      Living  1      SAB      IAB      Ectopic      Multiple  0   Live Births  1            Home Medications    Prior to Admission medications   Medication Sig Start Date End Date Taking? Authorizing Provider  azithromycin (ZITHROMAX Z-PAK) 250 MG tablet Take 2 tablets (500 mg total) by mouth daily for 1 day, THEN 1 tablet (250 mg total) daily for 4 days. 01/23/22 01/28/22 Yes Beola Vasallo, Acie Fredrickson, FNP  Accu-Chek Softclix Lancets lancets Use as instructed 05/07/19   Fair, Hoyle Sauer, MD  amLODipine (NORVASC) 10 MG tablet Take 1 tablet (10 mg total) by mouth daily. 06/26/19   Reva Bores, MD  aspirin EC 81 MG tablet Take 1 tablet (81 mg total) by mouth daily. Take after 12 weeks for prevention of preeclampsia later in pregnancy 05/13/19   Levie Heritage, DO  doxycycline (VIBRAMYCIN) 100 MG capsule Take  1 capsule (100 mg total) by mouth 2 (two) times daily. 04/29/20   Dartha Lodge, PA-C  ferrous sulfate 325 (65 FE) MG tablet Take 1 tablet (325 mg total) by mouth daily with breakfast. 04/29/19   Fair, Hoyle Sauer, MD  glucose blood (ACCU-CHEK GUIDE) test strip Use as instructed 04/29/19   Fair, Hoyle Sauer, MD  hydrochlorothiazide (HYDRODIURIL) 25 MG tablet Take 1 tablet (25 mg total) by mouth daily. 06/26/19   Reva Bores, MD  ibuprofen (ADVIL) 800 MG tablet Take 1 tablet (800 mg total) by mouth every 8 (eight) hours as needed for moderate pain. 08/06/20   Georgetta Haber, NP  Insulin Syringe-Needle U-100 (INSULIN SYRINGE 1CC/31GX5/16") 31G X 5/16" 1 ML MISC 1 Device by Does not apply route 4 (four) times daily. 05/26/19   Fair, Hoyle Sauer, MD  labetalol (NORMODYNE) 200 MG tablet Take  3 tablets (600 mg total) by mouth 2 (two) times daily. 06/11/19   Adam Phenix, MD  metFORMIN (GLUCOPHAGE) 500 MG tablet Take 1 tablet at bedtime nightly 06/04/19   Dove, Myra C, MD  norethindrone (MICRONOR) 0.35 MG tablet Take 1 tablet (0.35 mg total) by mouth daily. Begin 4-6 weeks after birth 06/25/19   Reva Bores, MD  Prenatal Vit-Fe Fumarate-FA (PRENATAL MULTIVITAMIN) TABS tablet Take 1 tablet by mouth daily at 12 noon.    [provider]  tiZANidine (ZANAFLEX) 2 MG tablet Take 1-2 tablets (2-4 mg total) by mouth at bedtime. 08/06/20   Georgetta Haber, NP  Vitamin D, Ergocalciferol, (DRISDOL) 1.25 MG (50000 UNIT) CAPS capsule Take 1 capsule by mouth once a week 06/27/19   [place] Bing, MD    Family History Family History  Problem Relation Age of Onset   Cancer Mother    Leukemia Mother    Diabetes Mother     Social History Social History   Tobacco Use   Smoking status: Never   Smokeless tobacco: Never  Vaping Use   Vaping Use: Never used  Substance Use Topics   Alcohol use: Not Currently   Drug use: Yes    Types: Marijuana     Allergies   Shrimp [shellfish allergy], Aleve [naproxen sodium], Cherry, and Penicillins   Review of Systems Review of Systems Per HPI  Physical Exam Triage Vital Signs ED Triage Vitals [01/23/22 1442]  Enc Vitals Group     BP (!) 168/101     Pulse Rate (!) 101     Resp 18     Temp 98.1 F (36.7 C)     Temp src      SpO2 98 %     Weight      Height      Head Circumference      Peak Flow      Pain Score 0     Pain Loc      Pain Edu?      Excl. in GC?    No data found.  Updated Vital Signs BP (!) 155/97   Pulse (!) 101   Temp 98.1 F (36.7 C)   Resp 18   SpO2 98%   Visual Acuity Right Eye Distance:   Left Eye Distance:   Bilateral Distance:    Right Eye Near:   Left Eye Near:    Bilateral Near:     Physical Exam Constitutional:      General: She is not in acute distress.    Appearance: Normal  appearance. She is not toxic-appearing or  diaphoretic.  HENT:     Head: Normocephalic and atraumatic.     Right Ear: Tympanic membrane and ear canal normal.     Left Ear: Tympanic membrane and ear canal normal.     Nose: No congestion.     Mouth/Throat:     Mouth: Mucous membranes are moist.     Pharynx: Posterior oropharyngeal erythema present. No pharyngeal swelling, oropharyngeal exudate or uvula swelling.     Tonsils: Tonsillar exudate present. No tonsillar abscesses.  Eyes:     Extraocular Movements: Extraocular movements intact.     Conjunctiva/sclera: Conjunctivae normal.     Pupils: Pupils are equal, round, and reactive to light.  Cardiovascular:     Rate and Rhythm: Normal rate and regular rhythm.     Pulses: Normal pulses.     Heart sounds: Normal heart sounds.  Pulmonary:     Effort: Pulmonary effort is normal. No respiratory distress.     Breath sounds: Normal breath sounds. No stridor. No wheezing, rhonchi or rales.  Abdominal:     General: Abdomen is flat. Bowel sounds are normal.     Palpations: Abdomen is soft.  Genitourinary:    Comments: Deferred with shared decision-making.  Self swab performed. Musculoskeletal:        General: Normal range of motion.     Cervical back: Normal range of motion.  Skin:    General: Skin is warm and dry.  Neurological:     General: No focal deficit present.     Mental Status: She is alert and oriented to person, place, and time. Mental status is at baseline.     Cranial Nerves: Cranial nerves 2-12 are intact.     Sensory: Sensation is intact.     Motor: Motor function is intact.     Coordination: Coordination is intact.     Gait: Gait is intact.  Psychiatric:        Mood and Affect: Mood normal.        Behavior: Behavior normal.        Thought Content: Thought content normal.        Judgment: Judgment normal.      UC Treatments / Results  Labs (all labs ordered are listed, but only abnormal results are displayed) Labs  Reviewed  POCT RAPID STREP A (OFFICE)  CERVICOVAGINAL ANCILLARY ONLY  CYTOLOGY, (ORAL, ANAL, URETHRAL) ANCILLARY ONLY    EKG   Radiology No results found.  Procedures Procedures (including critical care time)  Medications Ordered in UC Medications - No data to display  Initial Impression / Assessment and Plan / UC Course  I have reviewed the triage vital signs and the nursing notes.  Pertinent labs & imaging results that were available during my care of the patient were reviewed by me and considered in my medical decision making (see chart for details).     Strep was negative but I am still highly suspicious of strep throat given appearance of posterior pharynx on exam.  Will opt to treat with azithromycin given penicillin allergy.  Throat culture is pending.  No signs of peritonsillar abscess on exam.  Patient is very concerned for STDs in the throat so cytology swab of the throat as well cervicovaginal swab is pending.  Will await results for treatment.  Discussed return precautions.  Patient verbalized understanding and was agreeable with plan.  Blood pressure is also elevated with retake being slightly improved.  No signs of hypertensive urgency, endorgan damage, and neuro exam is normal  so do not think that emergent evaluation is necessary.  Patient to monitor blood pressure at home and follow-up with PCP if it remains elevated. Final Clinical Impressions(s) / UC Diagnoses   Final diagnoses:  Sore throat  Screening examination for venereal disease     Discharge Instructions      Your STD test are pending.  We will call if they are positive and treat as appropriate.  I am highly suspicious of strep throat so you are being treated with antibiotics for this.  Please follow-up if symptoms persist or worsen.    ED Prescriptions     Medication Sig Dispense Auth. Provider   azithromycin (ZITHROMAX Z-PAK) 250 MG tablet Take 2 tablets (500 mg total) by mouth daily for 1 day,  THEN 1 tablet (250 mg total) daily for 4 days. 6 tablet [place], Acie Fredrickson, [place]      PDMP not reviewed this encounter.   Gustavus Bryant, [place] 01/23/22 1525    Gustavus Bryant, [place] 01/23/22 1526

## 2022-01-23 NOTE — Discharge Instructions (Signed)
Your strep was negative in clinic today.  Your mono was negative.  Your STI swabs from your other visit are pending.  We did give you an injection of Rocephin in clinic today in case your symptoms are related to gonorrhea.  You should receive these results through MyChart within a few days.  If you are positive all partners will need to be tested and treated as well.  Gargle with warm salt water and alternate Tylenol ibuprofen.  If you are negative and your symptoms do not respond to the medication I recommend you follow-up with ENT.  Call them to schedule an appointment.  If you develop any difficulty swallowing, high fever, shortness of breath, pain with swallowing, change in your voice you need to go to the emergency room.

## 2022-01-23 NOTE — ED Triage Notes (Signed)
Patient having sore throat onset of yesterday with white spots on the back of her throat. 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   Patient states she is wanting to rule out an STD in the throat. States she did not start having symptoms until after oral sex.

## 2022-01-23 NOTE — Discharge Instructions (Signed)
Your STD test are pending.  We will call if they are positive and treat as appropriate.  I am highly suspicious of strep throat so you are being treated with antibiotics for this.  Please follow-up if symptoms persist or worsen.

## 2022-01-23 NOTE — ED Provider Notes (Signed)
[facility]    [name]: [id number] Arrival date & time: 01/23/22  1600      History   Chief Complaint Chief Complaint  Patient presents with   Sore Throat   SEXUALLY TRANSMITTED DISEASE    HPI Tricia Ray is a 31 y.o. female.   Patient presents today for evaluation of sore throat.  She was seen in different urgent care earlier today at which point she was empirically treated for strep throat.  She is concerned about possibly having gonorrhea in her throat as she has had similar episode in the past with identical symptoms.  Reports that she has had oral sex with a female partner a few days ago as well as a female partner more recently.  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  She denies additional symptoms including fever, nausea, vomiting.  She reports sore throat is rated 1/2 on a 0-10 pain scale, localized to posterior oropharynx, described as aching, no aggravating relieving factors identified.  Denies additional antibiotic use recently.  She has not yet started antibiotics prescribed from her earlier visit.  She is requesting rapid strep testing and further evaluation if appropriate today.  She did have STI swab collected from her throat and vagina at the other visit.    Past Medical History:  Diagnosis Date   Anemia    Asthma    Gestational diabetes    Gonorrhea    Hypertension    pregnancy induced   Morbid obesity (HCC)    PCOS (polycystic ovarian syndrome)    Seasonal allergies     Patient Active Problem List   Diagnosis Date Noted   Diabetes in pregnancy 06/21/2019   Chronic hypertension 06/21/2019   COVID-19 virus infection 06/21/2019   Severe pre-eclampsia 06/21/2019   Low vitamin D level 05/25/2019   BMI 50.0-59.9, adult (HCC) 05/21/2019   LGA (large for gestational age) fetus affecting management of mother 05/21/2019   Chronic hypertension during pregnancy 05/13/2019   Obesity affecting  pregnancy 05/09/2019   Supervision of high risk pregnancy, antepartum 04/23/2019   GDM (gestational diabetes mellitus) 04/23/2019   UTI (urinary tract infection) during pregnancy 04/23/2019   Asthma    PCOS (polycystic ovarian syndrome)     Past Surgical History:  Procedure Laterality Date   CESAREAN SECTION N/A 06/23/2019   Procedure: CESAREAN SECTION;  Surgeon: Adam Phenix, MD;  Location: [facility];  Service: Obstetrics;  Laterality: N/A;   HAND SURGERY      OB History     Gravida  1   Para  1   Term  1   Preterm      AB      Living  1      SAB      IAB      Ectopic      Multiple  0   Live Births  1            Home Medications    Prior to Admission medications   Medication Sig Start Date End Date Taking? Authorizing Provider  Accu-Chek Softclix Lancets lancets Use as instructed 05/07/19   Fair, Hoyle Sauer, MD  amLODipine (NORVASC) 10 MG tablet Take 1 tablet (10 mg total) by mouth daily. 06/26/19   Reva Bores, MD  aspirin EC 81 MG tablet Take 1 tablet (81 mg total) by mouth daily. Take after 12 weeks for prevention of preeclampsia later in pregnancy 05/13/19  Levie Heritage, DO  azithromycin (ZITHROMAX Z-PAK) 250 MG tablet Take 2 tablets (500 mg total) by mouth daily for 1 day, THEN 1 tablet (250 mg total) daily for 4 days. 01/23/22 01/28/22  Gustavus Bryant, FNP  doxycycline (VIBRAMYCIN) 100 MG capsule Take 1 capsule (100 mg total) by mouth 2 (two) times daily. 04/29/20   Dartha Lodge, PA-C  ferrous sulfate 325 (65 FE) MG tablet Take 1 tablet (325 mg total) by mouth daily with breakfast. 04/29/19   Fair, Hoyle Sauer, MD  glucose blood (ACCU-CHEK GUIDE) test strip Use as instructed 04/29/19   Fair, Hoyle Sauer, MD  hydrochlorothiazide (HYDRODIURIL) 25 MG tablet Take 1 tablet (25 mg total) by mouth daily. 06/26/19   Reva Bores, MD  ibuprofen (ADVIL) 800 MG tablet Take 1 tablet (800 mg total) by mouth every 8 (eight) hours as needed for moderate pain.  08/06/20   Georgetta Haber, NP  Insulin Syringe-Needle U-100 (INSULIN SYRINGE 1CC/31GX5/16") 31G X 5/16" 1 ML MISC 1 Device by Does not apply route 4 (four) times daily. 05/26/19   Fair, Hoyle Sauer, MD  labetalol (NORMODYNE) 200 MG tablet Take 3 tablets (600 mg total) by mouth 2 (two) times daily. 06/11/19   Adam Phenix, MD  metFORMIN (GLUCOPHAGE) 500 MG tablet Take 1 tablet at bedtime nightly 06/04/19   Dove, Myra C, MD  norethindrone (MICRONOR) 0.35 MG tablet Take 1 tablet (0.35 mg total) by mouth daily. Begin 4-6 weeks after birth 06/25/19   Reva Bores, MD  Prenatal Vit-Fe Fumarate-FA (PRENATAL MULTIVITAMIN) TABS tablet Take 1 tablet by mouth daily at 12 noon.    [provider]  tiZANidine (ZANAFLEX) 2 MG tablet Take 1-2 tablets (2-4 mg total) by mouth at bedtime. 08/06/20   Georgetta Haber, NP  Vitamin D, Ergocalciferol, (DRISDOL) 1.25 MG (50000 UNIT) CAPS capsule Take 1 capsule by mouth once a week 06/27/19   [place] Bing, MD    Family History Family History  Problem Relation Age of Onset   Cancer Mother    Leukemia Mother    Diabetes Mother     Social History Social History   Tobacco Use   Smoking status: Never   Smokeless tobacco: Never  Vaping Use   Vaping Use: Never used  Substance Use Topics   Alcohol use: Not Currently   Drug use: Yes    Types: Marijuana     Allergies   Shrimp [shellfish allergy], Aleve [naproxen sodium], Cherry, and Penicillins   Review of Systems Review of Systems  Constitutional:  Negative for activity change, appetite change, fatigue and fever.  HENT:  Positive for sore throat. Negative for congestion, trouble swallowing and voice change.   Respiratory:  Negative for cough and shortness of breath.   Cardiovascular:  Negative for chest pain.  Gastrointestinal:  Negative for abdominal pain, diarrhea, nausea and vomiting.     Physical Exam Triage Vital Signs ED Triage Vitals  Enc Vitals Group     BP 01/23/22 1702 (!)  141/92     Pulse Rate 01/23/22 1702 77     Resp --      Temp 01/23/22 1702 99.2 F (37.3 C)     Temp Source 01/23/22 1702 Axillary     SpO2 01/23/22 1702 98 %     Weight 01/23/22 1703 270 lb 1 oz (122.5 kg)     Height 01/23/22 1703 5\' 7"  (1.702 m)     Head Circumference --  Peak Flow --      Pain Score 01/23/22 1703 1     Pain Loc --      Pain Edu? --      Excl. in GC? --    No data found.  Updated Vital Signs BP (!) 141/92 (BP Location: Right Wrist)   Pulse 77   Temp 99.2 F (37.3 C) (Axillary)   Ht 5\' 7"  (1.702 m)   Wt 270 lb 1 oz (122.5 kg)   LMP 01/23/2022 (Exact Date)   SpO2 98%   BMI 42.30 kg/m   Visual Acuity Right Eye Distance:   Left Eye Distance:   Bilateral Distance:    Right Eye Near:   Left Eye Near:    Bilateral Near:     Physical Exam Vitals reviewed.  Constitutional:      General: She is awake. She is not in acute distress.    Appearance: Normal appearance. She is well-developed. She is not ill-appearing.     Comments: Very pleasant female appears stated age in no acute distress sitting comfortably in exam room  HENT:     Head: Normocephalic and atraumatic.     Right Ear: External ear normal.     Left Ear: External ear normal.     Mouth/Throat:     Pharynx: Uvula midline. No oropharyngeal exudate or posterior oropharyngeal erythema.     Tonsils: Tonsillar exudate present. No tonsillar abscesses. 2+ on the right. 2+ on the left.  Cardiovascular:     Rate and Rhythm: Normal rate and regular rhythm.     Heart sounds: Normal heart sounds, S1 normal and S2 normal. No murmur heard. Pulmonary:     Effort: Pulmonary effort is normal.     Breath sounds: Normal breath sounds. No wheezing, rhonchi or rales.     Comments: Clear to auscultation bilaterally Lymphadenopathy:     Head:     Right side of head: No submental, submandibular or tonsillar adenopathy.     Left side of head: No submental, submandibular or tonsillar adenopathy.     Cervical:  No cervical adenopathy.  Psychiatric:        Behavior: Behavior is cooperative.      UC Treatments / Results  Labs (all labs ordered are listed, but only abnormal results are displayed) Labs Reviewed  POCT RAPID STREP A, ED / UC  POCT INFECTIOUS MONO SCREEN, ED / UC    EKG   Radiology No results found.  Procedures Procedures (including critical care time)  Medications Ordered in UC Medications  cefTRIAXone (ROCEPHIN) injection 500 mg (has no administration in time range)    Initial Impression / Assessment and Plan / UC Course  I have reviewed the triage vital signs and the nursing notes.  Pertinent labs & imaging results that were available during my care of the patient were reviewed by me and considered in my medical decision making (see chart for details).     Patient does have exudative tonsillitis on exam.  She requested strep testing as they were unable to perform rapid strep at her previous visit.  Rapid strep testing was obtained and was negative.  Mono testing was also negative.  Patient reports that her symptoms are identical to previous episodes of gonococcal pharyngitis.  She is requesting treatment if appropriate.  She was given 500 mg of Rocephin in clinic.  She has safely taken this medication in the past with most recent dose 2021 without side effects or concerns.  Discussed that this  does not confirm gonococcal pharyngitis but given her presentation and history of the same we will go ahead and cover her.  If she is positive all partners need to be tested and treated as well.  STI swab of throat and vagina were painted at previous visit and not repeated in clinic today.  Discussed that if she has any worsening symptoms she needs to be seen immediately.  Strict return precautions given to which she expressed understanding.  All questions answered to patient satisfaction.  Final Clinical Impressions(s) / UC Diagnoses   Final diagnoses:  Pharyngitis, unspecified  etiology  Exudative tonsillitis     Discharge Instructions      Your strep was negative in clinic today.  Your mono was negative.  Your STI swabs from your other visit are pending.  We did give you an injection of Rocephin in clinic today in case your symptoms are related to gonorrhea.  You should receive these results through MyChart within a few days.  If you are positive all partners will need to be tested and treated as well.  Gargle with warm salt water and alternate Tylenol ibuprofen.  If you are negative and your symptoms do not respond to the medication I recommend you follow-up with ENT.  Call them to schedule an appointment.  If you develop any difficulty swallowing, high fever, shortness of breath, pain with swallowing, change in your voice you need to go to the emergency room.     ED Prescriptions   None    PDMP not reviewed this encounter.   Jeani Hawking, PA-C 01/23/22 1757

## 2022-01-23 NOTE — ED Triage Notes (Signed)
Pt is present today with c/o sore throat and tonsil inflammation. Pt sx started yesterday

## 2022-01-24 LAB — CERVICOVAGINAL ANCILLARY ONLY
Bacterial Vaginitis (gardnerella): POSITIVE — AB
Candida Glabrata: NEGATIVE
Candida Vaginitis: POSITIVE — AB
Chlamydia: NEGATIVE
Comment: NEGATIVE
Comment: NEGATIVE
Comment: NEGATIVE
Comment: NEGATIVE
Comment: NEGATIVE
Comment: NORMAL
Neisseria Gonorrhea: NEGATIVE
Trichomonas: NEGATIVE

## 2022-01-24 LAB — CYTOLOGY, (ORAL, ANAL, URETHRAL) ANCILLARY ONLY
Chlamydia: NEGATIVE
Comment: NEGATIVE
Comment: NEGATIVE
Comment: NORMAL
Neisseria Gonorrhea: POSITIVE — AB
Trichomonas: NEGATIVE

## 2022-01-25 ENCOUNTER — Telehealth (HOSPITAL_COMMUNITY): Payer: Self-pay | Admitting: Emergency Medicine

## 2022-01-25 MED ORDER — FLUCONAZOLE 150 MG PO TABS: 150.0000 mg | ORAL_TABLET | Freq: Once | ORAL | 0 refills | Status: AC

## 2022-01-25 MED ORDER — METRONIDAZOLE 500 MG PO TABS: 500.0000 mg | ORAL_TABLET | Freq: Two times a day (BID) | ORAL | 0 refills | Status: DC

## 2022-01-26 LAB — CULTURE, GROUP A STREP (THRC)

## 2022-02-04 ENCOUNTER — Ambulatory Visit (HOSPITAL_COMMUNITY)
Admission: EM | Admit: 2022-02-04 | Discharge: 2022-02-04 | Disposition: A | Discharge status: Home / Self Care | Payer: Medicaid Other | Attending: Internal Medicine | Admitting: Internal Medicine

## 2022-02-04 ENCOUNTER — Other Ambulatory Visit: Payer: Self-pay

## 2022-02-04 ENCOUNTER — Encounter (HOSPITAL_COMMUNITY): Payer: Self-pay | Admitting: *Deleted

## 2022-02-04 DIAGNOSIS — A545 Gonococcal pharyngitis: Secondary | ICD-10-CM | POA: Diagnosis not present

## 2022-02-04 NOTE — ED Triage Notes (Signed)
Pt received anti-Bx injection for STD in throat. Pt rweturns today for 2nd injection.

## 2022-02-04 NOTE — Discharge Instructions (Addendum)
We re-tested you for gonorrhea to the throat.  We will call you if your results are positive requiring further treatment.  If results are negative, you will not hear from [place].  Continue taking ibuprofen as needed for pain.  If you develop any new or worsening symptoms or do not improve in the next 2 to 3 days, please return.  If your symptoms are severe, please go to the emergency room.  Follow-up with your primary care provider for further evaluation and management of your symptoms as well as ongoing wellness visits.  I hope you feel better!

## 2022-02-04 NOTE — ED Provider Notes (Signed)
[facility]    [name]: [id number] Arrival date & time: 02/04/22  1204      History   Chief Complaint Chief Complaint  Patient presents with  . follow up anti-bx injection    HPI Tricia Ray is a 31 y.o. female.   Patient presents urgent care for reevaluation of throat pain    Past Medical History:  Diagnosis Date  . Anemia   . Asthma   . Gestational diabetes   . Gonorrhea   . Hypertension    pregnancy induced  . Morbid obesity (HCC)   . PCOS (polycystic ovarian syndrome)   . Seasonal allergies     Patient Active Problem List   Diagnosis Date Noted  . Diabetes in pregnancy 06/21/2019  . Chronic hypertension 06/21/2019  . COVID-19 virus infection 06/21/2019  . Severe pre-eclampsia 06/21/2019  . Low vitamin D level 05/25/2019  . BMI 50.0-59.9, adult (HCC) 05/21/2019  . LGA (large for gestational age) fetus affecting management of mother 05/21/2019  . Chronic hypertension during pregnancy 05/13/2019  . Obesity affecting pregnancy 05/09/2019  . Supervision of high risk pregnancy, antepartum 04/23/2019  . GDM (gestational diabetes mellitus) 04/23/2019  . UTI (urinary tract infection) during pregnancy 04/23/2019  . Asthma   . PCOS (polycystic ovarian syndrome)     Past Surgical History:  Procedure Laterality Date  . CESAREAN SECTION N/A 06/23/2019   Procedure: CESAREAN SECTION;  Surgeon: Adam Phenix, MD;  Location: [facility]S;  Service: Obstetrics;  Laterality: N/A;  . HAND SURGERY      OB History     Gravida  1   Para  1   Term  1   Preterm      AB      Living  1      SAB      IAB      Ectopic      Multiple  0   Live Births  1            Home Medications    Prior to Admission medications   Medication Sig Start Date End Date Taking? Authorizing Provider  Accu-Chek Softclix Lancets lancets Use as instructed 05/07/19   Fair, Hoyle Sauer, MD  amLODipine (NORVASC) 10 MG tablet Take 1 tablet (10 mg total) by mouth daily.  06/26/19   Reva Bores, MD  aspirin EC 81 MG tablet Take 1 tablet (81 mg total) by mouth daily. Take after 12 weeks for prevention of preeclampsia later in pregnancy 05/13/19   Levie Heritage, DO  ferrous sulfate 325 (65 FE) MG tablet Take 1 tablet (325 mg total) by mouth daily with breakfast. 04/29/19   Fair, Hoyle Sauer, MD  glucose blood (ACCU-CHEK GUIDE) test strip Use as instructed 04/29/19   Fair, Hoyle Sauer, MD  hydrochlorothiazide (HYDRODIURIL) 25 MG tablet Take 1 tablet (25 mg total) by mouth daily. 06/26/19   Reva Bores, MD  ibuprofen (ADVIL) 800 MG tablet Take 1 tablet (800 mg total) by mouth every 8 (eight) hours as needed for moderate pain. 08/06/20   Georgetta Haber, NP  Insulin Syringe-Needle U-100 (INSULIN SYRINGE 1CC/31GX5/16") 31G X 5/16" 1 ML MISC 1 Device by Does not apply route 4 (four) times daily. 05/26/19   Fair, Hoyle Sauer, MD  labetalol (NORMODYNE) 200 MG tablet Take 3 tablets (600 mg total) by mouth 2 (two) times daily. 06/11/19   Adam Phenix, MD  metFORMIN (GLUCOPHAGE) 500 MG tablet Take 1 tablet at bedtime  nightly 06/04/19   Allie Bossier, MD  metroNIDAZOLE (FLAGYL) 500 MG tablet Take 1 tablet (500 mg total) by mouth 2 (two) times daily. 01/25/22   Merrilee Jansky, MD  norethindrone (MICRONOR) 0.35 MG tablet Take 1 tablet (0.35 mg total) by mouth daily. Begin 4-6 weeks after birth 06/25/19   Reva Bores, MD  Prenatal Vit-Fe Fumarate-FA (PRENATAL MULTIVITAMIN) TABS tablet Take 1 tablet by mouth daily at 12 noon.    [provider]  tiZANidine (ZANAFLEX) 2 MG tablet Take 1-2 tablets (2-4 mg total) by mouth at bedtime. 08/06/20   Georgetta Haber, NP  Vitamin D, Ergocalciferol, (DRISDOL) 1.25 MG (50000 UNIT) CAPS capsule Take 1 capsule by mouth once a week 06/27/19   [place] Bing, MD    Family History Family History  Problem Relation Age of Onset  . Cancer Mother   . Leukemia Mother   . Diabetes Mother     Social History Social History   Tobacco Use   . Smoking status: Never  . Smokeless tobacco: Never  Vaping Use  . Vaping Use: Never used  Substance Use Topics  . Alcohol use: Not Currently  . Drug use: Yes    Types: Marijuana     Allergies   Shrimp [shellfish allergy], Aleve [naproxen sodium], Cherry, and Penicillins   Review of Systems Review of Systems   Physical Exam Triage Vital Signs ED Triage Vitals  Enc Vitals Group     BP 02/04/22 1258 (!) 146/90     Pulse Rate 02/04/22 1258 100     Resp 02/04/22 1258 18     Temp 02/04/22 1258 98.9 F (37.2 C)     Temp src --      SpO2 02/04/22 1258 100 %     Weight --      Height --      Head Circumference --      Peak Flow --      Pain Score 02/04/22 1256 0     Pain Loc --      Pain Edu? --      Excl. in GC? --    No data found.  Updated Vital Signs BP (!) 146/90   Pulse 100   Temp 98.9 F (37.2 C)   Resp 18   LMP 01/23/2022 (Exact Date)   SpO2 100%   Visual Acuity Right Eye Distance:   Left Eye Distance:   Bilateral Distance:    Right Eye Near:   Left Eye Near:    Bilateral Near:     Physical Exam   UC Treatments / Results  Labs (all labs ordered are listed, but only abnormal results are displayed) Labs Reviewed - No data to display  EKG   Radiology No results found.  Procedures Procedures (including critical care time)  Medications Ordered in UC Medications - No data to display  Initial Impression / Assessment and Plan / UC Course  I have reviewed the triage vital signs and the nursing notes.  Pertinent labs & imaging results that were available during my care of the patient were reviewed by me and considered in my medical decision making (see chart for details).     *** Final Clinical Impressions(s) / UC Diagnoses   Final diagnoses:  None   Discharge Instructions   None    ED Prescriptions   None    PDMP not reviewed this encounter.

## 2022-02-08 LAB — CYTOLOGY, (ORAL, ANAL, URETHRAL) ANCILLARY ONLY
Chlamydia: NEGATIVE
Comment: NEGATIVE
Comment: NEGATIVE
Comment: NORMAL
Neisseria Gonorrhea: POSITIVE — AB
Trichomonas: NEGATIVE

## 2022-02-09 ENCOUNTER — Telehealth (HOSPITAL_COMMUNITY): Payer: Self-pay | Admitting: Emergency Medicine

## 2022-02-09 NOTE — Telephone Encounter (Signed)
Per Dr. Leonides Grills, patient to be re-treated with 500mg  IM Rocephin for positive oral Gonorrhea.  Patient to return in 2 weeks for another test-of-cure.   Contacted patient by phone.  Verified identity using two identifiers.  Provided positive result.  Reviewed safe sex practices, notifying partners, and refraining from sexual activities for 7 days from time of treatment.  Patient verified understanding, all questions answered.   [facility] notified

## 2022-02-11 ENCOUNTER — Ambulatory Visit
Admission: EM | Admit: 2022-02-11 | Discharge: 2022-02-11 | Disposition: A | Discharge status: Home / Self Care | Payer: Medicaid Other | Attending: Internal Medicine | Admitting: Internal Medicine

## 2022-02-11 DIAGNOSIS — A545 Gonococcal pharyngitis: Secondary | ICD-10-CM

## 2022-02-11 MED ORDER — CEFTRIAXONE SODIUM 1 G IJ SOLR
1.0000 g | Freq: Once | INTRAMUSCULAR | Status: AC
  Administered 2022-02-11: 1 g via INTRAMUSCULAR

## 2022-02-11 NOTE — Discharge Instructions (Signed)
You are being treated with an antibiotic injection today.  Please follow-up if symptoms persist or worsen.

## 2022-02-11 NOTE — ED Triage Notes (Signed)
Pt c/o f/u s/p gonorrhea. Reports having 1 dose of tx at [address] but someone told her she needed a second dose b/c oral gonorrhea is challenging to tx so she reports today for her second tx.

## 2022-02-11 NOTE — ED Provider Notes (Signed)
[name]    [name]: [id number] Arrival date & time: 02/11/22  0956      History   Chief Complaint Chief Complaint  Patient presents with   sti treatment    HPI Tricia Ray is a 31 y.o. female.   Patient presents today to receive treatment after recently testing positive for gonorrhea in the throat.  Patient was seen multiple times for this.  Patient was seen on 01/23/2022 at [facility] urgent care.  Rapid strep test was negative and cytology swab of throat was pending.  Patient left [facility] urgent care and went to urgent care on [facility] to have work-up completed.  She received IM Rocephin that visit.  She returned on 02/04/2022 given persistent symptoms and had retest completed that showed gonorrhea on cytology swab.  Patient was called by nursing staff and advised to come back for a second shot.  Patient reports that she does not have a sore throat but feels like there is "swelling" in her throat.  She has been taking ibuprofen for symptoms.  Patient states that she is upset "because she has been told multiple things by multiple different people" as far as testing and treatment.      Past Medical History:  Diagnosis Date   Anemia    Asthma    Gestational diabetes    Gonorrhea    Hypertension    pregnancy induced   Morbid obesity (HCC)    PCOS (polycystic ovarian syndrome)    Seasonal allergies     Patient Active Problem List   Diagnosis Date Noted   Diabetes in pregnancy 06/21/2019   Chronic hypertension 06/21/2019   COVID-19 virus infection 06/21/2019   Severe pre-eclampsia 06/21/2019   Low vitamin D level 05/25/2019   BMI 50.0-59.9, adult (HCC) 05/21/2019   LGA (large for gestational age) fetus affecting management of mother 05/21/2019   Chronic hypertension during pregnancy 05/13/2019   Obesity affecting pregnancy 05/09/2019   Supervision of high risk pregnancy, antepartum 04/23/2019   GDM (gestational diabetes mellitus) 04/23/2019   UTI (urinary  tract infection) during pregnancy 04/23/2019   Asthma    PCOS (polycystic ovarian syndrome)     Past Surgical History:  Procedure Laterality Date   CESAREAN SECTION N/A 06/23/2019   Procedure: CESAREAN SECTION;  Surgeon: Adam Phenix, MD;  Location: [facility] LD ORS;  Service: Obstetrics;  Laterality: N/A;   HAND SURGERY      OB History     Gravida  1   Para  1   Term  1   Preterm      AB      Living  1      SAB      IAB      Ectopic      Multiple  0   Live Births  1            Home Medications    Prior to Admission medications   Medication Sig Start Date End Date Taking? Authorizing Provider  Accu-Chek Softclix Lancets lancets Use as instructed 05/07/19   Fair, Hoyle Sauer, MD  amLODipine (NORVASC) 10 MG tablet Take 1 tablet (10 mg total) by mouth daily. 06/26/19   Reva Bores, MD  aspirin EC 81 MG tablet Take 1 tablet (81 mg total) by mouth daily. Take after 12 weeks for prevention of preeclampsia later in pregnancy 05/13/19   Levie Heritage, DO  ferrous sulfate 325 (65 FE) MG tablet Take 1 tablet (325 mg total)  by mouth daily with breakfast. 04/29/19   Fair, Hoyle Sauer, MD  glucose blood (ACCU-CHEK GUIDE) test strip Use as instructed 04/29/19   Fair, Hoyle Sauer, MD  hydrochlorothiazide (HYDRODIURIL) 25 MG tablet Take 1 tablet (25 mg total) by mouth daily. 06/26/19   Reva Bores, MD  ibuprofen (ADVIL) 800 MG tablet Take 1 tablet (800 mg total) by mouth every 8 (eight) hours as needed for moderate pain. 08/06/20   Georgetta Haber, NP  Insulin Syringe-Needle U-100 (INSULIN SYRINGE 1CC/31GX5/16") 31G X 5/16" 1 ML MISC 1 Device by Does not apply route 4 (four) times daily. 05/26/19   Fair, Hoyle Sauer, MD  labetalol (NORMODYNE) 200 MG tablet Take 3 tablets (600 mg total) by mouth 2 (two) times daily. 06/11/19   Adam Phenix, MD  metFORMIN (GLUCOPHAGE) 500 MG tablet Take 1 tablet at bedtime nightly 06/04/19   Dove, Myra C, MD  metroNIDAZOLE (FLAGYL) 500 MG tablet Take  1 tablet (500 mg total) by mouth 2 (two) times daily. 01/25/22   Merrilee Jansky, MD  norethindrone (MICRONOR) 0.35 MG tablet Take 1 tablet (0.35 mg total) by mouth daily. Begin 4-6 weeks after birth 06/25/19   Reva Bores, MD  Prenatal Vit-Fe Fumarate-FA (PRENATAL MULTIVITAMIN) TABS tablet Take 1 tablet by mouth daily at 12 noon.    [provider]  tiZANidine (ZANAFLEX) 2 MG tablet Take 1-2 tablets (2-4 mg total) by mouth at bedtime. 08/06/20   Georgetta Haber, NP  Vitamin D, Ergocalciferol, (DRISDOL) 1.25 MG (50000 UNIT) CAPS capsule Take 1 capsule by mouth once a week 06/27/19   [place] Bing, MD    Family History Family History  Problem Relation Age of Onset   Cancer Mother    Leukemia Mother    Diabetes Mother     Social History Social History   Tobacco Use   Smoking status: Never   Smokeless tobacco: Never  Vaping Use   Vaping Use: Never used  Substance Use Topics   Alcohol use: Not Currently   Drug use: Yes    Types: Marijuana     Allergies   Shrimp [shellfish allergy], Aleve [naproxen sodium], Cherry, and Penicillins   Review of Systems Review of Systems Per HPI  Physical Exam Triage Vital Signs ED Triage Vitals  Enc Vitals Group     BP 02/11/22 1011 135/81     Pulse Rate 02/11/22 1011 90     Resp 02/11/22 1011 16     Temp 02/11/22 1011 98.1 F (36.7 C)     Temp Source 02/11/22 1011 Oral     SpO2 02/11/22 1011 98 %     Weight --      Height --      Head Circumference --      Peak Flow --      Pain Score 02/11/22 1012 0     Pain Loc --      Pain Edu? --      Excl. in GC? --    No data found.  Updated Vital Signs BP 135/81 (BP Location: Left Arm)   Pulse 90   Temp 98.1 F (36.7 C) (Oral)   Resp 16   LMP 01/23/2022 (Exact Date)   SpO2 98%   Visual Acuity Right Eye Distance:   Left Eye Distance:   Bilateral Distance:    Right Eye Near:   Left Eye Near:    Bilateral Near:     Physical Exam Constitutional:  General:  She is not in acute distress.    Appearance: Normal appearance. She is not toxic-appearing or diaphoretic.  HENT:     Head: Normocephalic and atraumatic.     Mouth/Throat:     Pharynx: No pharyngeal swelling, oropharyngeal exudate or posterior oropharyngeal erythema.     Comments: Very mild erythema to posterior pharynx.  No exudate noted.  Tonsils appear normal. Eyes:     Extraocular Movements: Extraocular movements intact.     Conjunctiva/sclera: Conjunctivae normal.  Pulmonary:     Effort: Pulmonary effort is normal.  Neurological:     General: No focal deficit present.     Mental Status: She is alert and oriented to person, place, and time. Mental status is at baseline.  Psychiatric:        Mood and Affect: Mood normal.        Behavior: Behavior normal.        Thought Content: Thought content normal.        Judgment: Judgment normal.      UC Treatments / Results  Labs (all labs ordered are listed, but only abnormal results are displayed) Labs Reviewed - No data to display  EKG   Radiology No results found.  Procedures Procedures (including critical care time)  Medications Ordered in UC Medications  cefTRIAXone (ROCEPHIN) injection 1 g (1 g Intramuscular Given 02/11/22 1033)    Initial Impression / Assessment and Plan / UC Course  I have reviewed the triage vital signs and the nursing notes.  Pertinent labs & imaging results that were available during my care of the patient were reviewed by me and considered in my medical decision making (see chart for details).    Given recent cytology results of throat were positive for gonorrhea and patient has persistent symptoms, will opt to treat with IM Rocephin.  Will do 1000 mg IM Rocephin this time given best evidence reports.  Advised that she may return in 2 weeks to have test of cure.  Discussed return precautions.  Patient verbalized understanding and was agreeable with plan. Final Clinical Impressions(s) / UC  Diagnoses   Final diagnoses:  Gonorrhea of pharynx     Discharge Instructions      You are being treated with an antibiotic injection today.  Please follow-up if symptoms persist or worsen.    ED Prescriptions   None    PDMP not reviewed this encounter.   Gustavus Bryant, [place] 02/11/22 1101

## 2022-03-07 ENCOUNTER — Ambulatory Visit (HOSPITAL_COMMUNITY)
Admission: EM | Admit: 2022-03-07 | Discharge: 2022-03-07 | Disposition: A | Discharge status: Home / Self Care | Payer: Medicaid Other | Attending: Family Medicine | Admitting: Family Medicine

## 2022-03-07 ENCOUNTER — Encounter (HOSPITAL_COMMUNITY): Payer: Self-pay | Admitting: Emergency Medicine

## 2022-03-07 DIAGNOSIS — Z202 Contact with and (suspected) exposure to infections with a predominantly sexual mode of transmission: Secondary | ICD-10-CM | POA: Diagnosis not present

## 2022-03-07 DIAGNOSIS — Z113 Encounter for screening for infections with a predominantly sexual mode of transmission: Secondary | ICD-10-CM | POA: Diagnosis present

## 2022-03-07 NOTE — ED Triage Notes (Signed)
Reports had gonorrhea in throat and got two rounds of shots for infection. Reports that still has some irritation in throat, so wants retesting.

## 2022-03-07 NOTE — Discharge Instructions (Signed)
We have sent testing for sexually transmitted infections. We will notify you of any positive results once they are received. If required, we will prescribe any medications you might need.  Please refrain from all sexual activity for at least the next seven days.  

## 2022-03-08 LAB — CYTOLOGY, (ORAL, ANAL, URETHRAL) ANCILLARY ONLY
Chlamydia: NEGATIVE
Comment: NEGATIVE
Comment: NORMAL
Neisseria Gonorrhea: NEGATIVE

## 2022-03-09 NOTE — ED Provider Notes (Signed)
[facility]   [id number] 03/07/22 Arrival Time: 1900  ASSESSMENT & PLAN:  1. Screening for STDs (sexually transmitted diseases)    No tx. Here for test of cure.   Discharge Instructions      We have sent testing for sexually transmitted infections. We will notify you of any positive results once they are received. If required, we will prescribe any medications you might need.  Please refrain from all sexual activity for at least the next seven days.     Without s/s of PID.  Labs Reviewed  CYTOLOGY, (ORAL, ANAL, URETHRAL) ANCILLARY ONLY  CERVICOVAGINAL ANCILLARY ONLY   Will notify of any positive results. Instructed to refrain from sexual activity for at least seven days.  Reviewed expectations re: course of current medical issues. Questions answered. Outlined signs and symptoms indicating need for more acute intervention. Patient verbalized understanding. After Visit Summary given.   SUBJECTIVE:  Tricia Ray is a 31 y.o. female who reports oral gonorrhea; has been treated twice with Rocephin. Mild throat "irritation". Desires test of cure. Afebrile. Normal PO intake.  Patient's last menstrual period was 02/19/2022.   OBJECTIVE:  Vitals:   03/07/22 1916  BP: (!) 142/80  Pulse: 92  Resp: 17  Temp: 98.9 F (37.2 C)  TempSrc: Oral  SpO2: 98%    General appearance: alert, cooperative, appears stated age and no distress Oral: normal throat Skin: warm and dry Psychological: alert and cooperative; normal mood and affect.  Results for orders placed or performed during the hospital encounter of 03/07/22  Cytology (oral, anal, urethral) ancillary only  Result Value Ref Range   Neisseria Gonorrhea Negative    Chlamydia Negative    Comment Normal Reference Ranger Chlamydia - Negative    Comment      Normal Reference Range Neisseria Gonorrhea - Negative    Labs Reviewed  CYTOLOGY, (ORAL, ANAL, URETHRAL) ANCILLARY ONLY  CERVICOVAGINAL ANCILLARY  ONLY    Allergies  Allergen Reactions   Shrimp [Shellfish Allergy] Anaphylaxis    Itching and swelling in mounth   Aleve [Naproxen Sodium] Hives and Swelling   Cherry Hives, Itching and Swelling   Penicillins Other (See Comments)    Causes pink eye, blurry vision Did it involve swelling of the face/tongue/throat, SOB, or low BP? No Did it involve sudden or severe rash/hives, skin peeling, or any reaction on the inside of your mouth or nose? No Did you need to seek medical attention at a hospital or doctor's office? No When did it last happen?    2018   If all above answers are "NO", may proceed with cephalosporin use.     Past Medical History:  Diagnosis Date   Anemia    Asthma    Gestational diabetes    Gonorrhea    Hypertension    pregnancy induced   Morbid obesity ([place])    PCOS (polycystic ovarian syndrome)    Seasonal allergies    Family History  Problem Relation Age of Onset   Cancer Mother    Leukemia Mother    Diabetes Mother    Social History   Socioeconomic History   Marital status: Single    Spouse name: Not on file   Number of children: Not on file   Years of education: Not on file   Highest education level: Not on file  Occupational History   Not on file  Tobacco Use   Smoking status: Never   Smokeless tobacco: Never  Vaping Use   Vaping Use:  Never used  Substance and Sexual Activity   Alcohol use: Not Currently   Drug use: Yes    Types: Marijuana   Sexual activity: Yes    Comment: with female partner  Other Topics Concern   Not on file  Social History Narrative   Not on file   Social Determinants of Health   Financial Resource Strain: Not on file  Food Insecurity: No Food Insecurity (04/29/2019)   Hunger Vital Sign    Worried About Running Out of Food in the Last Year: Never true    Ran Out of Food in the Last Year: Never true  Transportation Needs: No Transportation Needs (04/29/2019)   PRAPARE - Radiographer, therapeutic (Medical): No    Lack of Transportation (Non-Medical): No  Physical Activity: Not on file  Stress: Not on file  Social Connections: Not on file  Intimate Partner Violence: Not on file           Vanessa Kick, MD 03/09/22 [phone]

## 2022-03-10 ENCOUNTER — Telehealth (HOSPITAL_COMMUNITY): Payer: Self-pay | Admitting: Emergency Medicine

## 2022-03-10 NOTE — Telephone Encounter (Signed)
Patient returned call.  She is already aware and has returned for recollect

## 2022-03-10 NOTE — Telephone Encounter (Signed)
Patient will need to return for recollect of cervicovaginal swab.   Attempted to reach patient x 1, LVM

## 2022-03-18 ENCOUNTER — Encounter: Payer: Self-pay | Admitting: Emergency Medicine

## 2022-03-18 ENCOUNTER — Ambulatory Visit
Admission: EM | Admit: 2022-03-18 | Discharge: 2022-03-18 | Disposition: A | Discharge status: Home / Self Care | Payer: Medicaid Other | Attending: Family Medicine | Admitting: Family Medicine

## 2022-03-18 DIAGNOSIS — M549 Dorsalgia, unspecified: Secondary | ICD-10-CM | POA: Insufficient documentation

## 2022-03-18 DIAGNOSIS — R070 Pain in throat: Secondary | ICD-10-CM | POA: Diagnosis not present

## 2022-03-18 DIAGNOSIS — M545 Low back pain, unspecified: Secondary | ICD-10-CM | POA: Diagnosis not present

## 2022-03-18 LAB — POCT RAPID STREP A (OFFICE): Rapid Strep A Screen: NEGATIVE

## 2022-03-18 MED ORDER — IBUPROFEN 800 MG PO TABS: 800.0000 mg | ORAL_TABLET | Freq: Three times a day (TID) | ORAL | 0 refills | Status: DC | PRN

## 2022-03-18 MED ORDER — TIZANIDINE HCL 2 MG PO TABS: 2.0000 mg | ORAL_TABLET | Freq: Three times a day (TID) | ORAL | 0 refills | Status: DC | PRN
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
## 2022-03-18 NOTE — Discharge Instructions (Addendum)
Your strep test is negative.  Culture of the throat will be sent, and staff will notify you if that is in turn positive.  Staff will notify you if anything is positive on the other swab  Take ibuprofen 800 mg--1 tab every 8 hours as needed for pain.   Take tizanidine 4 mg--1 every 8 hours as needed for muscle spasms

## 2022-03-20 LAB — CYTOLOGY, (ORAL, ANAL, URETHRAL) ANCILLARY ONLY
Chlamydia: NEGATIVE
Comment: NEGATIVE
Comment: NEGATIVE
Comment: NORMAL
Neisseria Gonorrhea: NEGATIVE
Trichomonas: NEGATIVE

## 2022-03-22 LAB — CULTURE, GROUP A STREP (THRC)

## 2022-04-14 ENCOUNTER — Ambulatory Visit: Payer: Medicaid Other | Admitting: Family Medicine

## 2022-09-04 ENCOUNTER — Other Ambulatory Visit: Payer: Self-pay

## 2022-09-04 ENCOUNTER — Emergency Department (HOSPITAL_COMMUNITY)
Admission: EM | Admit: 2022-09-04 | Discharge: 2022-09-04 | Discharge status: Against Medical Advice | Payer: Medicaid Other | Attending: Emergency Medicine | Admitting: Emergency Medicine

## 2022-09-04 ENCOUNTER — Ambulatory Visit (HOSPITAL_COMMUNITY): Admission: RE | Admit: 2022-09-04 | Payer: Medicaid Other | Source: Ambulatory Visit

## 2022-09-04 DIAGNOSIS — R112 Nausea with vomiting, unspecified: Secondary | ICD-10-CM | POA: Diagnosis not present

## 2022-09-04 DIAGNOSIS — R109 Unspecified abdominal pain: Secondary | ICD-10-CM | POA: Diagnosis not present

## 2022-09-04 DIAGNOSIS — Z5321 Procedure and treatment not carried out due to patient leaving prior to being seen by health care provider: Secondary | ICD-10-CM | POA: Diagnosis not present

## 2022-09-04 LAB — CBC
HCT: 41 % (ref 36.0–46.0)
Hemoglobin: 13 g/dL (ref 12.0–15.0)
MCH: 26.8 pg (ref 26.0–34.0)
MCHC: 31.7 g/dL (ref 30.0–36.0)
MCV: 84.5 fL (ref 80.0–100.0)
Platelets: 245 10*3/uL (ref 150–400)
RBC: 4.85 MIL/uL (ref 3.87–5.11)
RDW: 14.8 % (ref 11.5–15.5)
WBC: 6 10*3/uL (ref 4.0–10.5)
nRBC: 0 % (ref 0.0–0.2)

## 2022-09-04 LAB — I-STAT BETA HCG BLOOD, ED (MC, WL, AP ONLY): I-stat hCG, quantitative: <5 m[IU]/mL (ref <5)

## 2022-09-04 LAB — COMPREHENSIVE METABOLIC PANEL
ALT: 512 U/L — ABNORMAL HIGH (ref 0–44)
AST: 169 U/L — ABNORMAL HIGH (ref 15–41)
Albumin: 3.5 g/dL (ref 3.5–5.0)
Alkaline Phosphatase: 185 U/L — ABNORMAL HIGH (ref 38–126)
Anion gap: 12 (ref 5–15)
BUN: 5 mg/dL — ABNORMAL LOW (ref 6–20)
CO2: 24 mmol/L (ref 22–32)
Calcium: 8.9 mg/dL (ref 8.9–10.3)
Chloride: 100 mmol/L (ref 98–111)
Creatinine, Ser: 1.12 mg/dL — ABNORMAL HIGH (ref 0.44–1.00)
GFR, Estimated: >60 mL/min (ref >60)
Glucose, Bld: 118 mg/dL — ABNORMAL HIGH (ref 70–99)
Potassium: 2.9 mmol/L — ABNORMAL LOW (ref 3.5–5.1)
Sodium: 136 mmol/L (ref 135–145)
Total Bilirubin: 4.1 mg/dL — ABNORMAL HIGH (ref 0.3–1.2)
Total Protein: 7.2 g/dL (ref 6.5–8.1)

## 2022-09-04 LAB — URINALYSIS, ROUTINE W REFLEX MICROSCOPIC
Glucose, UA: NEGATIVE mg/dL
Hgb urine dipstick: NEGATIVE
Ketones, ur: 5 mg/dL — AB
Nitrite: NEGATIVE
Protein, ur: 30 mg/dL — AB
Specific Gravity, Urine: 1.025 (ref 1.005–1.030)
pH: 5 (ref 5.0–8.0)

## 2022-09-04 LAB — LIPASE, BLOOD: Lipase: 23 U/L (ref 11–51)

## 2022-09-04 NOTE — ED Triage Notes (Signed)
Pt states she thinks she is dehydrated, has been vomiting but has not in the past two days.

## 2022-09-04 NOTE — ED Notes (Signed)
Pt called numerous times for vitals, no answer

## 2022-09-04 NOTE — ED Provider Triage Note (Signed)
Emergency Medicine Provider Triage Evaluation Note  Tricia Ray , a 32 y.o. female  was evaluated in triage.  Pt complains of abdominal cramping, nausea, vomiting. Was vomiting actively on Friday / Saturday, reports feeling better now, tolerating gatorade for last 2 days but concerned about possible dehydration. LMP early march.  Review of Systems  Positive: Nausea, vomiting, abdominal cramping Negative: Fever, chills, dysuria  Physical Exam  BP (!) 149/91 (BP Location: Right Arm)   Pulse 80   Temp 98.9 F (37.2 C) (Oral)   Resp 18   SpO2 100%  Gen:   Awake, no distress   Resp:  Normal effort  MSK:   Moves extremities without difficulty  Other:  No ttp of epigastric region  Medical Decision Making  Medically screening exam initiated at 5:51 PM.  Appropriate orders placed.  Amadea Winrow was informed that the remainder of the evaluation will be completed by another provider, this initial triage assessment does not replace that evaluation, and the importance of remaining in the ED until their evaluation is complete.  Workup initiated in triage    Anselmo Pickler, [place] 09/04/22 1752

## 2022-09-05 ENCOUNTER — Encounter (HOSPITAL_COMMUNITY): Payer: Self-pay

## 2022-09-05 ENCOUNTER — Ambulatory Visit (HOSPITAL_COMMUNITY)
Admission: EM | Admit: 2022-09-05 | Discharge: 2022-09-05 | Disposition: A | Discharge status: Home / Self Care | Payer: Medicaid Other | Attending: Emergency Medicine | Admitting: Emergency Medicine

## 2022-09-05 DIAGNOSIS — Z113 Encounter for screening for infections with a predominantly sexual mode of transmission: Secondary | ICD-10-CM | POA: Diagnosis present

## 2022-09-05 DIAGNOSIS — R110 Nausea: Secondary | ICD-10-CM | POA: Insufficient documentation

## 2022-09-05 DIAGNOSIS — M545 Low back pain, unspecified: Secondary | ICD-10-CM | POA: Diagnosis not present

## 2022-09-05 LAB — POCT URINALYSIS DIPSTICK, ED / UC
Glucose, UA: NEGATIVE mg/dL
Ketones, ur: 15 mg/dL — AB
Nitrite: NEGATIVE
Protein, ur: 30 mg/dL — AB
Specific Gravity, Urine: 1.025 (ref 1.005–1.030)
Urobilinogen, UA: 1 mg/dL (ref 0.0–1.0)
pH: 6 (ref 5.0–8.0)

## 2022-09-05 MED ORDER — ONDANSETRON 4 MG PO TBDP: 4.0000 mg | ORAL_TABLET | Freq: Four times a day (QID) | ORAL | 0 refills | Status: DC | PRN

## 2022-09-05 MED ORDER — IBUPROFEN 800 MG PO TABS: 800.0000 mg | ORAL_TABLET | Freq: Three times a day (TID) | ORAL | 0 refills | Status: AC | PRN

## 2022-09-05 NOTE — Discharge Instructions (Addendum)
We will call you if anything on your swab returns positive. Please abstain from sexual intercourse until your results return.  You can take the zofran every 6 hours as needed.  I recommend increasing your water intake to AT LEAST 64 oz daily. Hypothetically you should be drinking a lot more than this.   You can use ibuprofen up to 3x daily for back pain.  If anything grows on urine culture we will notify you.

## 2022-09-05 NOTE — ED Provider Notes (Signed)
[place]    [name]: [id number] Arrival date & time: 09/05/22  0803      History   Chief Complaint Chief Complaint  Patient presents with   Back Pain    HPI Tricia Ray is a 32 y.o. female.  Here with 5-day history of low back pain  No injury or trauma. Has not taken any medications.  At first was having discomfort in her bladder but then started trying to drink more water and symptoms resolved. She denies dysuria, frequency, urgency, hematuria, fever, flank pain, abd pain. A little nausea on and off, no vomiting.   Denies vaginal symptoms. Would like STD testing.   She was triaged in the ED yesterday but left after lab work. Chart review shows hypokalemia 2.9, creatinine 1.12 Normal lipase, CBC Small leuks in the urine. No culture ordered. She LWBS Also elevation of her LFTs  LMP 3/20. Negate hcg quant  Past Medical History:  Diagnosis Date   Anemia    Asthma    Gestational diabetes    Gonorrhea    Hypertension    pregnancy induced   Morbid obesity    PCOS (polycystic ovarian syndrome)    Seasonal allergies     Patient Active Problem List   Diagnosis Date Noted   Diabetes in pregnancy 06/21/2019   Chronic hypertension 06/21/2019   COVID-19 virus infection 06/21/2019   Severe pre-eclampsia 06/21/2019   Low vitamin D level 05/25/2019   BMI 50.0-59.9, adult 05/21/2019   LGA (large for gestational age) fetus affecting management of mother 05/21/2019   Chronic hypertension during pregnancy 05/13/2019   Obesity affecting pregnancy 05/09/2019   Supervision of high risk pregnancy, antepartum 04/23/2019   GDM (gestational diabetes mellitus) 04/23/2019   UTI (urinary tract infection) during pregnancy 04/23/2019   Asthma    PCOS (polycystic ovarian syndrome)     Past Surgical History:  Procedure Laterality Date   CESAREAN SECTION N/A 06/23/2019   Procedure: CESAREAN SECTION;  Surgeon: Woodroe Mode, MD;  Location: [facility];  Service: Obstetrics;   Laterality: N/A;   HAND SURGERY      OB History     Gravida  1   Para  1   Term  1   Preterm      AB      Living  1      SAB      IAB      Ectopic      Multiple  0   Live Births  1            Home Medications    Prior to Admission medications   Medication Sig Start Date End Date Taking? Authorizing Provider  ondansetron (ZOFRAN-ODT) 4 MG disintegrating tablet Take 1 tablet (4 mg total) by mouth every 6 (six) hours as needed for nausea or vomiting. 09/05/22  Yes Jeremiah Curci, Wells Guiles, PA-C  Accu-Chek Softclix Lancets lancets Use as instructed 05/07/19   Fair, Marin Shutter, MD  amLODipine (NORVASC) 10 MG tablet Take 1 tablet (10 mg total) by mouth daily. 06/26/19   Donnamae Jude, MD  aspirin EC 81 MG tablet Take 1 tablet (81 mg total) by mouth daily. Take after 12 weeks for prevention of preeclampsia later in pregnancy 05/13/19   Truett Mainland, DO  ferrous sulfate 325 (65 FE) MG tablet Take 1 tablet (325 mg total) by mouth daily with breakfast. 04/29/19   Fair, Marin Shutter, MD  glucose blood (ACCU-CHEK GUIDE) test strip Use as instructed  04/29/19   Fair, Marin Shutter, MD  hydrochlorothiazide (HYDRODIURIL) 25 MG tablet Take 1 tablet (25 mg total) by mouth daily. 06/26/19   Donnamae Jude, MD  ibuprofen (ADVIL) 800 MG tablet Take 1 tablet (800 mg total) by mouth every 8 (eight) hours as needed (pain). 09/05/22   Chalmers Iddings, Wells Guiles, PA-C  Insulin Syringe-Needle U-100 (INSULIN SYRINGE 1CC/31GX5/16") 31G X 5/16" 1 ML MISC 1 Device by Does not apply route 4 (four) times daily. 05/26/19   Fair, Marin Shutter, MD  labetalol (NORMODYNE) 200 MG tablet Take 3 tablets (600 mg total) by mouth 2 (two) times daily. 06/11/19   Woodroe Mode, MD  metFORMIN (GLUCOPHAGE) 500 MG tablet Take 1 tablet at bedtime nightly 06/04/19   Dove, Myra C, MD  norethindrone (MICRONOR) 0.35 MG tablet Take 1 tablet (0.35 mg total) by mouth daily. Begin 4-6 weeks after birth 06/25/19   Donnamae Jude, MD  Prenatal Vit-Fe  Fumarate-FA (PRENATAL MULTIVITAMIN) TABS tablet Take 1 tablet by mouth daily at 12 noon.    [provider]  tiZANidine (ZANAFLEX) 2 MG tablet Take 1 tablet (2 mg total) by mouth every 8 (eight) hours as needed for muscle spasms. 03/18/22   Barrett Henle, MD  Vitamin D, Ergocalciferol, (DRISDOL) 1.25 MG (50000 UNIT) CAPS capsule Take 1 capsule by mouth once a week 06/27/19   Aletha Halim, MD    Family History Family History  Problem Relation Age of Onset   Cancer Mother    Leukemia Mother    Diabetes Mother     Social History Social History   Tobacco Use   Smoking status: Never   Smokeless tobacco: Never  Vaping Use   Vaping Use: Never used  Substance Use Topics   Alcohol use: Not Currently   Drug use: Yes    Types: Marijuana     Allergies   Shrimp [shellfish allergy], Aleve [naproxen sodium], Cherry, and Penicillins   Review of Systems Review of Systems As per HPI  Physical Exam Triage Vital Signs ED Triage Vitals [09/05/22 0859]  Enc Vitals Group     BP      Pulse      Resp      Temp      Temp src      SpO2      Weight      Height      Head Circumference      Peak Flow      Pain Score 9     Pain Loc      Pain Edu?      Excl. in [place]?    No data found.  Updated Vital Signs BP (!) 144/99 (BP Location: Right Arm)   Pulse 81   Temp 98.7 F (37.1 C) (Oral)   Resp 16   LMP 08/23/2022 (Approximate)   SpO2 98%    Physical Exam Vitals and nursing note reviewed.  Constitutional:      General: She is not in acute distress. HENT:     Mouth/Throat:     Mouth: Mucous membranes are moist.     Pharynx: Oropharynx is clear.  Eyes:     Conjunctiva/sclera: Conjunctivae normal.     Pupils: Pupils are equal, round, and reactive to light.  Cardiovascular:     Rate and Rhythm: Normal rate and regular rhythm.     Heart sounds: Normal heart sounds.  Pulmonary:     Effort: Pulmonary effort is normal.     Breath sounds: Normal  breath sounds.   Abdominal:     General: Bowel sounds are normal.     Palpations: Abdomen is soft.     Tenderness: There is no abdominal tenderness. There is no right CVA tenderness, left CVA tenderness, guarding or rebound.  Musculoskeletal:        General: No tenderness or signs of injury. Normal range of motion.  Neurological:     General: No focal deficit present.     Mental Status: She is alert and oriented to person, place, and time.     Cranial Nerves: No cranial nerve deficit.     Motor: No weakness.     UC Treatments / Results  Labs (all labs ordered are listed, but only abnormal results are displayed) Labs Reviewed  POCT URINALYSIS DIPSTICK, ED / UC - Abnormal; Notable for the following components:      Result Value   Bilirubin Urine MODERATE (*)    Ketones, ur 15 (*)    Hgb urine dipstick TRACE (*)    Protein, ur 30 (*)    Leukocytes,Ua TRACE (*)    All other components within normal limits  URINE CULTURE  CERVICOVAGINAL ANCILLARY ONLY    EKG  Radiology No results found.  Procedures Procedures (including critical care time)  Medications Ordered in UC Medications - No data to display  Initial Impression / Assessment and Plan / UC Course  I have reviewed the triage vital signs and the nursing notes.  Pertinent labs & imaging results that were available during my care of the patient were reviewed by me and considered in my medical decision making (see chart for details).  UA in clinic today trace leuks, trace hgb. Urine culture is pending. Wait for culture and treat if needed given her presentation not very consistent with UTI. Increase fluids given her bump in creatinine.  Low back pain likely muscular. No red flags. Ibuprofen sent. Zofran for nausea.  Cytology swab is pending. I recommend she follow up with PCP regarding her blood work. Return precautions discussed.   Final Clinical Impressions(s) / UC Diagnoses   Final diagnoses:  Acute bilateral low back pain  without sciatica  Nausea without vomiting  Screen for STD (sexually transmitted disease)     Discharge Instructions      We will call you if anything on your swab returns positive. Please abstain from sexual intercourse until your results return.  You can take the zofran every 6 hours as needed.  I recommend increasing your water intake to AT LEAST 64 oz daily. Hypothetically you should be drinking a lot more than this.   You can use ibuprofen up to 3x daily for back pain.  If anything grows on urine culture we will notify you.     ED Prescriptions     Medication Sig Dispense Auth. Provider   ondansetron (ZOFRAN-ODT) 4 MG disintegrating tablet Take 1 tablet (4 mg total) by mouth every 6 (six) hours as needed for nausea or vomiting. 20 tablet Kandis Henry, PA-C   ibuprofen (ADVIL) 800 MG tablet Take 1 tablet (800 mg total) by mouth every 8 (eight) hours as needed (pain). 21 tablet Rileyann Florance, Wells Guiles, PA-C      PDMP not reviewed this encounter.   Bari Handshoe, Wells Guiles, [place] 09/05/22 [id number]

## 2022-09-05 NOTE — ED Triage Notes (Signed)
Patient reports that she has had dysuria, urinary frequency, x 5 days. Patient states that she went to the ED yesterday and had a urine test completed, and blood work. Patient states she left AMA due to wait times.

## 2022-09-06 ENCOUNTER — Telehealth (HOSPITAL_COMMUNITY): Payer: Self-pay

## 2022-09-06 LAB — CERVICOVAGINAL ANCILLARY ONLY
Bacterial Vaginitis (gardnerella): POSITIVE — AB
Candida Glabrata: NEGATIVE
Candida Vaginitis: POSITIVE — AB
Chlamydia: NEGATIVE
Comment: NEGATIVE
Comment: NEGATIVE
Comment: NEGATIVE
Comment: NEGATIVE
Comment: NEGATIVE
Comment: NORMAL
Neisseria Gonorrhea: NEGATIVE
Trichomonas: NEGATIVE

## 2022-09-06 LAB — URINE CULTURE: Culture: 5000 — AB

## 2022-09-06 MED ORDER — FLUCONAZOLE 150 MG PO TABS: 150.0000 mg | ORAL_TABLET | Freq: Every day | ORAL | 0 refills | Status: DC

## 2022-09-06 MED ORDER — METRONIDAZOLE 500 MG PO TABS: 500.0000 mg | ORAL_TABLET | Freq: Two times a day (BID) | ORAL | 0 refills | Status: DC

## 2022-10-24 ENCOUNTER — Other Ambulatory Visit: Payer: Self-pay

## 2022-10-24 ENCOUNTER — Ambulatory Visit
Admission: RE | Admit: 2022-10-24 | Discharge: 2022-10-24 | Disposition: A | Discharge status: Home / Self Care | Payer: Medicaid Other | Source: Ambulatory Visit | Attending: Internal Medicine | Admitting: Internal Medicine

## 2022-10-24 VITALS — BP 145/104 | HR 86 | Temp 98.0°F | Resp 18

## 2022-10-24 DIAGNOSIS — J029 Acute pharyngitis, unspecified: Secondary | ICD-10-CM | POA: Diagnosis present

## 2022-10-24 LAB — POCT RAPID STREP A (OFFICE): Rapid Strep A Screen: NEGATIVE

## 2022-10-24 NOTE — ED Provider Notes (Signed)
[name]    [name]: [id number] Arrival date & time: 10/24/22  4098      History   Chief Complaint Chief Complaint  Patient presents with   Sore Throat   Exposure to STD    HPI Tricia Ray is a 32 y.o. female.   Patient presents to urgent care for evaluation of sore throat that started 3-4 ago. Patient is concerned she may have gonorrhea to the throat due to history of this. She participates in unprotected oral intercourse with both males and females and reports recent new sexual partners. 3-4 days ago, she participated in unprotected oral intercourse with recent new female partner. Female partner asymptomatic at the time of intercourse. States the next day, she felt some "tingling" to the throat. She looked at the back of her throat and noticed some white spots to the back of her throat. Reports 1/10 sore throat initially on symptom onset and has not worsened over the last 2-3 days. Denies vaginal symptoms, fever, chills, neck pain, or dizziness. No cough, congestion, or recent sick contacts with similar symptoms. She has an appointment with ENT this afternoon for evaluation as well.    Sore Throat  Exposure to STD    Past Medical History:  Diagnosis Date   Anemia    Asthma    Gestational diabetes    Gonorrhea    Hypertension    pregnancy induced   Morbid obesity (HCC)    PCOS (polycystic ovarian syndrome)    Seasonal allergies     Patient Active Problem List   Diagnosis Date Noted   Diabetes in pregnancy 06/21/2019   Chronic hypertension 06/21/2019   COVID-19 virus infection 06/21/2019   Severe pre-eclampsia 06/21/2019   Low vitamin D level 05/25/2019   BMI 50.0-59.9, adult (HCC) 05/21/2019   LGA (large for gestational age) fetus affecting management of mother 05/21/2019   Chronic hypertension during pregnancy 05/13/2019   Obesity affecting pregnancy 05/09/2019   Supervision of high risk pregnancy, antepartum 04/23/2019   GDM (gestational diabetes  mellitus) 04/23/2019   UTI (urinary tract infection) during pregnancy 04/23/2019   Asthma    PCOS (polycystic ovarian syndrome)     Past Surgical History:  Procedure Laterality Date   CESAREAN SECTION N/A 06/23/2019   Procedure: CESAREAN SECTION;  Surgeon: Adam Phenix, MD;  Location: [facility]S;  Service: Obstetrics;  Laterality: N/A;   HAND SURGERY      OB History     Gravida  1   Para  1   Term  1   Preterm      AB      Living  1      SAB      IAB      Ectopic      Multiple  0   Live Births  1            Home Medications    Prior to Admission medications   Medication Sig Start Date End Date Taking? Authorizing Provider  Accu-Chek Softclix Lancets lancets Use as instructed 05/07/19   Fair, Hoyle Sauer, MD  amLODipine (NORVASC) 10 MG tablet Take 1 tablet (10 mg total) by mouth daily. 06/26/19   Reva Bores, MD  aspirin EC 81 MG tablet Take 1 tablet (81 mg total) by mouth daily. Take after 12 weeks for prevention of preeclampsia later in pregnancy 05/13/19   Levie Heritage, DO  ferrous sulfate 325 (65 FE) MG tablet Take 1 tablet (325 mg  total) by mouth daily with breakfast. 04/29/19   Fair, Hoyle Sauer, MD  fluconazole (DIFLUCAN) 150 MG tablet Take 1 tablet (150 mg total) by mouth daily. Patient not taking: Reported on 10/24/2022 09/06/22   Merrilee Jansky, MD  glucose blood (ACCU-CHEK GUIDE) test strip Use as instructed 04/29/19   Fair, Hoyle Sauer, MD  hydrochlorothiazide (HYDRODIURIL) 25 MG tablet Take 1 tablet (25 mg total) by mouth daily. 06/26/19   Reva Bores, MD  ibuprofen (ADVIL) 800 MG tablet Take 1 tablet (800 mg total) by mouth every 8 (eight) hours as needed (pain). 09/05/22   Rising, Lurena Joiner, PA-C  Insulin Syringe-Needle U-100 (INSULIN SYRINGE 1CC/31GX5/16") 31G X 5/16" 1 ML MISC 1 Device by Does not apply route 4 (four) times daily. 05/26/19   Fair, Hoyle Sauer, MD  labetalol (NORMODYNE) 200 MG tablet Take 3 tablets (600 mg total) by mouth 2 (two)  times daily. 06/11/19   Adam Phenix, MD  metFORMIN (GLUCOPHAGE) 500 MG tablet Take 1 tablet at bedtime nightly 06/04/19   Dove, Myra C, MD  metroNIDAZOLE (FLAGYL) 500 MG tablet Take 1 tablet (500 mg total) by mouth 2 (two) times daily. Patient not taking: Reported on 10/24/2022 09/06/22   Merrilee Jansky, MD  norethindrone (MICRONOR) 0.35 MG tablet Take 1 tablet (0.35 mg total) by mouth daily. Begin 4-6 weeks after birth 06/25/19   Reva Bores, MD  ondansetron (ZOFRAN-ODT) 4 MG disintegrating tablet Take 1 tablet (4 mg total) by mouth every 6 (six) hours as needed for nausea or vomiting. 09/05/22   Rising, Lurena Joiner, PA-C  Prenatal Vit-Fe Fumarate-FA (PRENATAL MULTIVITAMIN) TABS tablet Take 1 tablet by mouth daily at 12 noon.    [provider]  tiZANidine (ZANAFLEX) 2 MG tablet Take 1 tablet (2 mg total) by mouth every 8 (eight) hours as needed for muscle spasms. 03/18/22   Zenia Resides, MD  Vitamin D, Ergocalciferol, (DRISDOL) 1.25 MG (50000 UNIT) CAPS capsule Take 1 capsule by mouth once a week 06/27/19   Bainbridge Bing, MD    Family History Family History  Problem Relation Age of Onset   Cancer Mother    Leukemia Mother    Diabetes Mother     Social History Social History   Tobacco Use   Smoking status: Never   Smokeless tobacco: Never  Vaping Use   Vaping Use: Never used  Substance Use Topics   Alcohol use: Not Currently   Drug use: Yes    Types: Marijuana     Allergies   Shrimp [shellfish allergy], Aleve [naproxen sodium], Cherry, and Penicillins   Review of Systems Review of Systems Per HPI  Physical Exam Triage Vital Signs ED Triage Vitals  Enc Vitals Group     BP 10/24/22 0949 (!) 145/104     Pulse Rate 10/24/22 0949 86     Resp 10/24/22 0949 18     Temp 10/24/22 0949 98 F (36.7 C)     Temp Source 10/24/22 0949 Oral     SpO2 10/24/22 0949 98 %     Weight --      Height --      Head Circumference --      Peak Flow --      Pain Score  10/24/22 0950 1     Pain Loc --      Pain Edu? --      Excl. in [place]? --    No data found.  Updated Vital Signs BP (!) 145/104 (BP  Location: Left Arm)   Pulse 86   Temp 98 F (36.7 C) (Oral)   Resp 18   SpO2 98%   Visual Acuity Right Eye Distance:   Left Eye Distance:   Bilateral Distance:    Right Eye Near:   Left Eye Near:    Bilateral Near:     Physical Exam Vitals and nursing note reviewed.  Constitutional:      Appearance: She is not ill-appearing or toxic-appearing.  HENT:     Head: Normocephalic and atraumatic.     Right Ear: Hearing, tympanic membrane, ear canal and external ear normal.     Left Ear: Hearing, tympanic membrane, ear canal and external ear normal.     Nose: Nose normal.     Mouth/Throat:     Lips: Pink.     Mouth: Mucous membranes are moist. No injury.     Tongue: No lesions. Tongue does not deviate from midline.     Palate: No mass and lesions.     Pharynx: Uvula midline. Pharyngeal swelling and posterior oropharyngeal erythema present. No oropharyngeal exudate or uvula swelling.     Tonsils: Tonsillar exudate (patchy white exudate to the bilateral tonsils) present. No tonsillar abscesses. 1+ on the right. 1+ on the left.  Eyes:     General: Lids are normal. Vision grossly intact. Gaze aligned appropriately.     Extraocular Movements: Extraocular movements intact.     Conjunctiva/sclera: Conjunctivae normal.  Cardiovascular:     Rate and Rhythm: Normal rate and regular rhythm.     Heart sounds: Normal heart sounds, S1 normal and S2 normal.  Pulmonary:     Effort: Pulmonary effort is normal. No respiratory distress.     Breath sounds: Normal breath sounds and air entry.  Musculoskeletal:     Cervical back: Neck supple.  Skin:    General: Skin is warm and dry.     Capillary Refill: Capillary refill takes less than 2 seconds.     Findings: No rash.  Neurological:     General: No focal deficit present.     Mental Status: She is alert and  oriented to person, place, and time. Mental status is at baseline.     Cranial Nerves: No dysarthria or facial asymmetry.  Psychiatric:        Mood and Affect: Mood normal.        Speech: Speech normal.        Behavior: Behavior normal.        Thought Content: Thought content normal.        Judgment: Judgment normal.      UC Treatments / Results  Labs (all labs ordered are listed, but only abnormal results are displayed) Labs Reviewed  CULTURE, GROUP A STREP [facility])  POCT RAPID STREP A (OFFICE)  CYTOLOGY, (ORAL, ANAL, URETHRAL) ANCILLARY ONLY  CERVICOVAGINAL ANCILLARY ONLY    EKG   Radiology No results found.  Procedures Procedures (including critical care time)  Medications Ordered in UC Medications - No data to display  Initial Impression / Assessment and Plan / UC Course  I have reviewed the triage vital signs and the nursing notes.  Pertinent labs & imaging results that were available during my care of the patient were reviewed by me and considered in my medical decision making (see chart for details).   1.  Acute pharyngitis Rapid strep testing is negative in clinic, throat culture is pending.  Patient requesting oral STD testing and vaginal STD testing, both of  these are pending and will come back in the next 2 to 3 days.  Encouraged to avoid intercourse until testing comes back.  Advised to avoid sexual intercourse for 7 days if testing is positive for any STDs.  No signs of peritonsillar abscess.  HEENT exam is stable.  Patient is nontoxic in appearance with hemodynamically stable vital signs.  She has an ear nose and throat appointment this afternoon.   Discussed physical exam and available lab work findings in clinic with patient.  Counseled patient regarding appropriate use of medications and potential side effects for all medications recommended or prescribed today. Discussed red flag signs and symptoms of worsening condition,when to call the PCP office, return to  urgent care, and when to seek higher level of care in the emergency department. Patient verbalizes understanding and agreement with plan. All questions answered. Patient discharged in stable condition.    Final Clinical Impressions(s) / UC Diagnoses   Final diagnoses:  Acute pharyngitis, unspecified etiology     Discharge Instructions      Strep is negative, throat culture is pending.  STD testing to the throat and the vagina pending. Will come back in 2-3 days, will call if anything is positive.   Avoid sexual intercourse until testing comes back. If you test positive for STD, no sex for 7 days during treatment.   If you develop any new or worsening symptoms or do not improve in the next 2 to 3 days, please return.  If your symptoms are severe, please go to the emergency room.  Follow-up with your primary care provider for further evaluation and management of your symptoms as well as ongoing wellness visits.  I hope you feel better!     ED Prescriptions   None    PDMP not reviewed this encounter.   Carlisle Beers, [place] 10/24/22 1020
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
## 2022-10-24 NOTE — Discharge Instructions (Signed)
Strep is negative, throat culture is pending.  STD testing to the throat and the vagina pending. Will come back in 2-3 days, will call if anything is positive.   Avoid sexual intercourse until testing comes back. If you test positive for STD, no sex for 7 days during treatment.   If you develop any new or worsening symptoms or do not improve in the next 2 to 3 days, please return.  If your symptoms are severe, please go to the emergency room.  Follow-up with your primary care provider for further evaluation and management of your symptoms as well as ongoing wellness visits.  I hope you feel better!

## 2022-10-25 LAB — CERVICOVAGINAL ANCILLARY ONLY
Bacterial Vaginitis (gardnerella): POSITIVE — AB
Candida Glabrata: NEGATIVE
Candida Vaginitis: NEGATIVE
Chlamydia: NEGATIVE
Comment: NEGATIVE
Comment: NEGATIVE
Comment: NEGATIVE
Comment: NEGATIVE
Comment: NEGATIVE
Comment: NORMAL
Neisseria Gonorrhea: NEGATIVE
Trichomonas: NEGATIVE

## 2022-10-25 LAB — CYTOLOGY, (ORAL, ANAL, URETHRAL) ANCILLARY ONLY
Chlamydia: NEGATIVE
Comment: NEGATIVE
Comment: NORMAL
Neisseria Gonorrhea: NEGATIVE

## 2022-10-26 ENCOUNTER — Telehealth (HOSPITAL_COMMUNITY): Payer: Self-pay | Admitting: Emergency Medicine

## 2022-10-26 LAB — CULTURE, GROUP A STREP (THRC)

## 2022-10-26 MED ORDER — METRONIDAZOLE 500 MG PO TABS: 500.0000 mg | ORAL_TABLET | Freq: Two times a day (BID) | ORAL | 0 refills | Status: DC

## 2022-11-29 ENCOUNTER — Ambulatory Visit (HOSPITAL_COMMUNITY)
Admission: EM | Admit: 2022-11-29 | Discharge: 2022-11-29 | Disposition: A | Discharge status: Home / Self Care | Payer: Medicaid Other

## 2022-11-29 ENCOUNTER — Ambulatory Visit (INDEPENDENT_AMBULATORY_CARE_PROVIDER_SITE_OTHER): Payer: Medicaid Other

## 2022-11-29 ENCOUNTER — Encounter (HOSPITAL_COMMUNITY): Payer: Self-pay

## 2022-11-29 DIAGNOSIS — J4541 Moderate persistent asthma with (acute) exacerbation: Secondary | ICD-10-CM

## 2022-11-29 DIAGNOSIS — J209 Acute bronchitis, unspecified: Secondary | ICD-10-CM

## 2022-11-29 MED ORDER — PREDNISONE 20 MG PO TABS: 20.0000 mg | ORAL_TABLET | Freq: Every day | ORAL | 0 refills | Status: DC

## 2022-11-29 MED ORDER — DOXYCYCLINE HYCLATE 50 MG PO CAPS: ORAL_CAPSULE | ORAL | 0 refills | Status: DC

## 2022-11-29 MED ORDER — IPRATROPIUM-ALBUTEROL 0.5-2.5 (3) MG/3ML IN SOLN
3.0000 mL | Freq: Once | RESPIRATORY_TRACT | Status: AC
  Administered 2022-11-29: 3 mL via RESPIRATORY_TRACT

## 2022-11-29 MED ORDER — IPRATROPIUM-ALBUTEROL 0.5-2.5 (3) MG/3ML IN SOLN
RESPIRATORY_TRACT | Status: AC
  Filled 2022-11-29: qty 3

## 2022-11-29 NOTE — ED Provider Notes (Signed)
[facility]    [name]: [id number] Arrival date & time: 11/29/22  1811      History   Chief Complaint Chief Complaint  Patient presents with   Cough    HPI Tricia Ray is a 32 y.o. female who presents due to having productive cough, wheezing SOB, sweats which started as a cold 2 weeks ago, but is getting worse in the past week. Her cough is productive with yellow mucous. She admits she is a smoker, but has not smoked in 2 weeks. Has hx of asthma and been using her inhaler twice a day but is not helping. She has also taken Elderberry, applied vick's rub on her chest. Has felt hot and cold, but does not have a thermometer to check her temp. Her chest feels heavy in the mornings from her wheezing. She denies doing a home covid test.  She states her asthma only flairs when she gets colds and rarely has to use her inhaler. Denies having a neb machine   Past Medical History:  Diagnosis Date   Anemia    Asthma    Gestational diabetes    Gonorrhea    Hypertension    pregnancy induced   Morbid obesity (HCC)    PCOS (polycystic ovarian syndrome)    Seasonal allergies     Patient Active Problem List   Diagnosis Date Noted   Diabetes in pregnancy 06/21/2019   Chronic hypertension 06/21/2019   COVID-19 virus infection 06/21/2019   Severe pre-eclampsia 06/21/2019   Low vitamin D level 05/25/2019   BMI 50.0-59.9, adult (HCC) 05/21/2019   LGA (large for gestational age) fetus affecting management of mother 05/21/2019   Chronic hypertension during pregnancy 05/13/2019   Obesity affecting pregnancy 05/09/2019   Supervision of high risk pregnancy, antepartum 04/23/2019   GDM (gestational diabetes mellitus) 04/23/2019   UTI (urinary tract infection) during pregnancy 04/23/2019   Asthma    PCOS (polycystic ovarian syndrome)     Past Surgical History:  Procedure Laterality Date   CESAREAN SECTION N/A 06/23/2019   Procedure: CESAREAN SECTION;  Surgeon: Adam Phenix, MD;   Location: [facility];  Service: Obstetrics;  Laterality: N/A;   HAND SURGERY      OB History     Gravida  1   Para  1   Term  1   Preterm      AB      Living  1      SAB      IAB      Ectopic      Multiple  0   Live Births  1            Home Medications    Prior to Admission medications   Medication Sig Start Date End Date Taking? Authorizing Provider  albuterol (VENTOLIN HFA) 108 (90 Base) MCG/ACT inhaler Inhale into the lungs every 6 (six) hours as needed for wheezing or shortness of breath.   Yes [provider]  doxycycline (VIBRAMYCIN) 50 MG capsule 2 bid x 10 days 11/29/22  Yes Rodriguez-Southworth, Nettie Elm, PA-C  ibuprofen (ADVIL) 800 MG tablet Take 1 tablet (800 mg total) by mouth every 8 (eight) hours as needed (pain). 09/05/22  Yes Rising, Lurena Joiner, PA-C  predniSONE (DELTASONE) 20 MG tablet Take 1 tablet (20 mg total) by mouth daily with breakfast. 11/29/22  Yes Rodriguez-Southworth, Nettie Elm, PA-C  Semaglutide,0.25 or 0.5MG /DOS, (OZEMPIC, 0.25 OR 0.5 MG/DOSE,) 2 MG/1.5ML SOPN Inject into the skin.   Yes [provider]  Accu-Chek Softclix Lancets lancets Use as instructed 05/07/19   Fair, Hoyle Sauer, MD  ferrous sulfate 325 (65 FE) MG tablet Take 1 tablet (325 mg total) by mouth daily with breakfast. 04/29/19   Fair, Hoyle Sauer, MD  glucose blood (ACCU-CHEK GUIDE) test strip Use as instructed 04/29/19   Fair, Hoyle Sauer, MD  Vitamin D, Ergocalciferol, (DRISDOL) 1.25 MG (50000 UNIT) CAPS capsule Take 1 capsule by mouth once a week 06/27/19   [place] Bing, MD    Family History Family History  Problem Relation Age of Onset   Cancer Mother    Leukemia Mother    Diabetes Mother     Social History Social History   Tobacco Use   Smoking status: Never   Smokeless tobacco: Never  Vaping Use   Vaping Use: Never used  Substance Use Topics   Alcohol use: Not Currently   Drug use: Yes    Types: Marijuana     Allergies   Shrimp  [shellfish allergy], Aleve [naproxen sodium], Cherry, and Penicillins   Review of Systems Review of Systems As noted in HPI  Physical Exam Triage Vital Signs ED Triage Vitals [11/29/22 1831]  Enc Vitals Group     BP 125/89     Pulse Rate 96     Resp 16     Temp 99.2 F (37.3 C)     Temp Source Oral     SpO2 96 %     Weight (!) 309 lb (140.2 kg)     Height 5\' 7"  (1.702 m)     Head Circumference      Peak Flow      Pain Score 0     Pain Loc      Pain Edu?      Excl. in GC?    No data found.  Updated Vital Signs BP 125/89 (BP Location: Right Arm)   Pulse 96   Temp 99.2 F (37.3 C) (Oral)   Resp 16   Ht 5\' 7"  (1.702 m)   Wt (!) 309 lb (140.2 kg)   LMP 11/08/2022 (Exact Date)   SpO2 96%   BMI 48.40 kg/m   Visual Acuity Right Eye Distance:   Left Eye Distance:   Bilateral Distance:    Right Eye Near:   Left Eye Near:    Bilateral Near:     Physical Exam Physical Exam Constitutional:      General: He is not in acute distress.    Appearance: He is not toxic-appearing.  HENT:     Head: Normocephalic.     Right Ear: Tympanic membrane, ear canal and external ear normal.     Left Ear: Ear canal and external ear normal.     Nose: Nose normal.     Mouth/Throat:     Mouth: Mucous membranes are moist.     Pharynx: Oropharynx is clear.  Eyes:     General: No scleral icterus.    Conjunctiva/sclera: Conjunctivae normal.  Cardiovascular:     Rate and Rhythm: Normal rate and regular rhythm.     Heart sounds: No murmur heard.   Pulmonary:     Effort: Pulmonary effort is normal. No respiratory distress.     Breath sounds: Wheezing present.     Comments: Has auditory wheezing. After duo neg the wheezing was almost gone, only faint ones heard on bases. Pulse ox was 98% after neb Musculoskeletal:        General: Normal range of motion.  Cervical back: Neck supple.  Lymphadenopathy:     Cervical: No cervical adenopathy.  Skin:    General: Skin is warm and dry.      Findings: No rash.  Neurological:     Mental Status: He is alert and oriented to person, place, and time.     Gait: Gait normal.  Psychiatric:        Mood and Affect: Mood normal.        Behavior: Behavior normal.        Thought Content: Thought content normal.        Judgment: Judgment normal.    UC Treatments / Results  Labs (all labs ordered are listed, but only abnormal results are displayed) Labs Reviewed - No data to display  EKG   Radiology DG Chest 2 View  Result Date: 11/29/2022 CLINICAL DATA:  Cough, shortness of breath EXAM: CHEST - 2 VIEW COMPARISON:  06/21/2019 FINDINGS: Cardiac size is within normal limits. There is interval decrease in transverse diameter of heart. There are no signs of pulmonary edema or focal pulmonary consolidation. There is no pleural effusion or pneumothorax. IMPRESSION: No active cardiopulmonary disease. Electronically Signed   By: Ernie Avena M.D.   On: 11/29/2022 19:18    Procedures Procedures (including critical care time)  Medications Ordered in UC Medications  ipratropium-albuterol (DUONEB) 0.5-2.5 (3) MG/3ML nebulizer solution 3 mL (3 mLs Nebulization Given 11/29/22 1902)    Initial Impression / Assessment and Plan / UC Course  I have reviewed the triage vital signs and the nursing notes. She was give a duo neb treatment which helped with the wheezing.  Pertinent  imaging results that were available during my care of the patient were reviewed by me and considered in my medical decision making (see chart for details).  Acute bronchitis  I placed her on Doxycycline and Prednisone as noted. See instructions.  Final Clinical Impressions(s) / UC Diagnoses   Final diagnoses:  Moderate persistent asthma with acute exacerbation  Acute bronchitis, unspecified organism     Discharge Instructions      Stay away from smoking Use your Inhaler 2 puffs every 4-6 hour while awake for wheezing for 5-7 days, then only as  needed after that.      ED Prescriptions     Medication Sig Dispense Auth. Provider   doxycycline (VIBRAMYCIN) 50 MG capsule 2 bid x 10 days 40 capsule Rodriguez-Southworth, Deyonte Cadden, PA-C   predniSONE (DELTASONE) 20 MG tablet Take 1 tablet (20 mg total) by mouth daily with breakfast. 5 tablet Rodriguez-Southworth, Nettie Elm, PA-C      PDMP not reviewed this encounter.   Garey Ham, Cordelia Poche 11/29/22 1929

## 2022-11-29 NOTE — Discharge Instructions (Signed)
Stay away from smoking Use your Inhaler 2 puffs every 4-6 hour while awake for wheezing for 5-7 days, then only as needed after that.

## 2022-11-29 NOTE — ED Triage Notes (Signed)
Patient here today with c/o productive cough, wheeze, SOB, stuffy nose, stopped up left ear X 2 weeks. She has a h/o asthma and states that her chest feels heavy. She has tried Mucinex and an inhaler with no relief. She also took Elderberry and Vicks Vapor rub. The Vicks soothes it some. Heat from the hot shower also helps. She is concerned about having Pneumonia.

## 2023-12-16 ENCOUNTER — Encounter: Payer: Self-pay | Admitting: Emergency Medicine

## 2023-12-16 ENCOUNTER — Ambulatory Visit: Admission: EM | Admit: 2023-12-16 | Discharge: 2023-12-16 | Disposition: A | Discharge status: Home / Self Care

## 2023-12-16 DIAGNOSIS — S0181XA Laceration without foreign body of other part of head, initial encounter: Secondary | ICD-10-CM | POA: Diagnosis not present

## 2023-12-16 MED ORDER — DOXYCYCLINE HYCLATE 100 MG PO CAPS: 100.0000 mg | ORAL_CAPSULE | Freq: Two times a day (BID) | ORAL | 0 refills | Status: AC
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
## 2023-12-16 NOTE — Discharge Instructions (Addendum)
 Facial laceration is actually fairly superficial.  Do not believe that suturing is required.  Dermabond (skin glue) is applied to the area.  May cover with a Band-Aid but no dressing is required.  May wash the area with soap and water starting today.  Do not apply any topical medication to the area until the skin glue has been in place for a week.  After this may use Vaseline to remove the skin glue and then may apply lotion to the area.  Start doxycycline  100 mg twice daily for 5 days for infection prevention.  Do not need to return to urgent care unless there is questions or concerns.

## 2023-12-27 DIAGNOSIS — F419 Anxiety disorder, unspecified: Secondary | ICD-10-CM | POA: Insufficient documentation

## 2023-12-27 NOTE — Progress Notes (Signed)
 Office Visit Note  Patient: Tricia Ray             Date of Birth: Dec 20, 1990           MRN: [id number]             PCP: Department, [facility] Referring: Desiree Quale, NP Visit Date: 01/10/2024 Occupation: @GUAROCC @  Subjective:  Joint pain  History of Present Illness: Tricia Ray is a 33 y.o. female seen for the evaluation of polyarthralgia and positive ANA.  According the patient her symptoms started in mid 2024 with pain and discomfort in her hands.  She states the discomfort increased this year.  She feels more stiffness in her hands during the colder temperatures.  Her symptoms get better when her hands are warm.  She states her hands and feet popped very easily and feel stiff.  She also has stairs in her house and she notices discomfort in her left knee joint when she is climbing stairs.  She also feels a stiffness in her left knee after prolonged sitting.  She has some discomfort in her neck and upper back.  None of the other joints are painful.  She notices intermittent swelling in her hands.  She gives history of morning stiffness.  There is no personal or family history of psoriasis.  She denies history of oral ulcers, nasal ulcers, malar rash, sicca symptoms, Raynaud's, lymphadenopathy or photosensitivity. She is right-handed.  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  She is single, gravida 1, para 1.  She has a 45-year-old child.  She had preeclampsia with the pregnancy.  There is no history of DVTs.  She enjoys walking for exercise.  She does not drink any alcohol.  She states she vapes flavored vape.  She is not vaping nicotine per patient.  There is no family history of autoimmune disease.    Activities of Daily Living:  Patient reports morning stiffness for 15-20 minutes.   Patient Reports nocturnal pain.  Difficulty dressing/grooming: Denies Difficulty climbing stairs: Reports Difficulty getting out of chair: Reports Difficulty using hands for  taps, buttons, cutlery, and/or writing: Reports  Review of Systems  Constitutional:  Negative for fatigue.  HENT:  Negative for mouth sores and mouth dryness.   Eyes:  Negative for dryness.  Respiratory:  Negative for shortness of breath.   Cardiovascular:  Negative for chest pain and palpitations.  Gastrointestinal:  Positive for constipation. Negative for blood in stool and diarrhea.  Endocrine: Positive for increased urination.  Genitourinary:  Negative for involuntary urination.  Musculoskeletal:  Positive for joint pain, joint pain, joint swelling and morning stiffness. Negative for gait problem, myalgias, muscle weakness, muscle tenderness and myalgias.  Skin:  Positive for hair loss. Negative for color change, rash and sensitivity to sunlight.  Allergic/Immunologic: Negative for susceptible to infections.  Neurological:  Negative for dizziness, tremors and headaches.  Hematological:  Negative for swollen glands.  Psychiatric/Behavioral:  Positive for sleep disturbance. Negative for depressed mood. The patient is nervous/anxious.     PMFS History:  Patient Active Problem List   Diagnosis Date Noted   Anxiety and depression 12/27/2023   Diabetes in pregnancy 06/21/2019   Chronic hypertension 06/21/2019   COVID-19 virus infection 06/21/2019   Severe pre-eclampsia 06/21/2019   Low vitamin D  level 05/25/2019   BMI 50.0-59.9, adult (HCC) 05/21/2019   LGA (large for gestational age) fetus affecting management of mother 05/21/2019   Chronic hypertension during pregnancy 05/13/2019   Obesity affecting  pregnancy 05/09/2019   Supervision of high risk pregnancy, antepartum 04/23/2019   GDM (gestational diabetes mellitus) 04/23/2019   UTI (urinary tract infection) during pregnancy 04/23/2019   Asthma    PCOS (polycystic ovarian syndrome)     Past Medical History:  Diagnosis Date   Anemia    Asthma    Gestational diabetes    Gonorrhea    Hypertension    pregnancy induced    Morbid obesity (HCC)    PCOS (polycystic ovarian syndrome)    Seasonal allergies     Family History  Problem Relation Age of Onset   Cancer Mother    Leukemia Mother    Diabetes Mother    Asthma Father    Healthy Daughter    Past Surgical History:  Procedure Laterality Date   CESAREAN SECTION N/A 06/23/2019   Procedure: CESAREAN SECTION;  Surgeon: [name], MD;  Location: [facility] LD [facility]S;  Service: Obstetrics;  Laterality: N/A;   HAND SURGERY Right    Social History   Social History Narrative   Not on file   Immunization History  Administered Date(s) Administered   Tdap 01/18/2018, 04/22/2019     Objective: Vital Signs: BP 122/81 (BP Location: Right Arm, Patient Position: Sitting, Cuff Size: Large)   Pulse 80   Resp 17   Ht 5' 6.5 (1.689 m)   Wt (!) 322 lb 9.6 oz (146.3 kg)   LMP 12/18/2023 (Approximate)   BMI 51.29 kg/m    Physical Exam Vitals and nursing note reviewed.  Constitutional:      Appearance: She is well-developed.  HENT:     Head: Normocephalic and atraumatic.  Eyes:     Conjunctiva/sclera: Conjunctivae normal.  Cardiovascular:     Rate and Rhythm: Normal rate and regular rhythm.     Heart sounds: Normal heart sounds.  Pulmonary:     Effort: Pulmonary effort is normal.     Breath sounds: Normal breath sounds.  Abdominal:     General: Bowel sounds are normal.     Palpations: Abdomen is soft.  Musculoskeletal:     Cervical back: Normal range of motion.  Lymphadenopathy:     Cervical: No cervical adenopathy.  Skin:    General: Skin is warm and dry.     Capillary Refill: Capillary refill takes less than 2 seconds.  Neurological:     Mental Status: She is alert and oriented to person, place, and time.  Psychiatric:        Behavior: Behavior normal.      Musculoskeletal Exam: Cervical thoracic and lumbar spine with good range of motion without tenderness.  Shoulders, elbow joints, wrist joints, MCPs PIPs and DIPs were in good  range of  motion without any synovitis or tenderness.  Hip joints and knee joints with good range of motion without any warmth swelling or effusion.  There was no tenderness over ankles or MTPs.  There was no plantar fasciitis or Achilles tendinitis.  CDAI Exam: CDAI Score: -- Patient Global: --; Provider Global: -- Swollen: --; Tender: -- Joint Exam 01/10/2024   No joint exam has been documented for this visit   There is currently no information documented on the homunculus. Go to the Rheumatology activity and complete the homunculus joint exam.  Investigation: No additional findings.  Imaging: XR Hand 2 View Left Result Date: 01/10/2024 No MCP, PIP, DIP, CMC, intercarpal or radiocarpal joint space narrowing was noted.  No erosive changes were noted. Impression: Unremarkable x-rays of the hand.  XR  Hand 2 View Right Result Date: 01/10/2024 No MCP, PIP, DIP, CMC, intercarpal or radiocarpal joint space narrowing was noted.  No erosive changes were noted. Impression: Unremarkable x-rays of the hand.  XR Foot 2 Views Left Result Date: 01/10/2024 No MTP, PIP or DIP narrowing was noted.  No intertarsal, tibiotalar or subtalar joint space narrowing was noted.  Inferior and posterior calcaneal spurs were noted. Impression: Early degenerative changes were noted.  XR Foot 2 Views Right Result Date: 01/10/2024 First MTP narrowing was noted.  No PIP or DIP narrowing was noted.  No intertarsal, tibiotalar or subtalar joint space narrowing was noted.  Inferior and posterior calcaneal spurs were noted. Impression: Early degenerative changes were noted.   Recent Labs: Lab Results  Component Value Date   WBC 6.0 09/04/2022   HGB 13.0 09/04/2022   PLT 245 09/04/2022   NA 136 09/04/2022   K 2.9 (L) 09/04/2022   CL 100 09/04/2022   CO2 24 09/04/2022   GLUCOSE 118 (H) 09/04/2022   BUN 5 (L) 09/04/2022   CREATININE 1.12 (H) 09/04/2022   BILITOT 4.1 (H) 09/04/2022   ALKPHOS 185 (H) 09/04/2022   AST 169 (H)  09/04/2022   ALT 512 (H) 09/04/2022   PROT 7.2 09/04/2022   ALBUMIN 3.5 09/04/2022   CALCIUM 8.9 09/04/2022   GFRAA >60 06/23/2019   August 02, 2023 ANA 1: 40 NH, uric acid 5.6, CRP 4.8, sed rate 17, vitamin D  13, CBC WBC 8.6, hemoglobin 13.3, platelets 237, folate normal, B12 303  Speciality Comments: No specialty comments available.  Procedures:  No procedures performed Allergies: Shrimp extract, Shrimp [shellfish allergy], Aleve [naproxen sodium], Cherry, and Penicillins   Assessment / Plan:     Visit Diagnoses: Positive ANA (antinuclear antibody) - History of severe preeclampsia -patient has low titer positive ANA which is not significant.  She denies any history for ulcers, nasal ulcers, malar rash, photosensitivity, Raynaud's, lymphadenopathy.  She gives history of arthralgias in multiple joints.  No synovitis was noted on the examination.  To complete the workup I will obtain following labs today.  I will discuss results at the follow-up visit.  Plan: CBC with Differential/Platelet, Comprehensive metabolic panel with GFR, Protein / creatinine ratio, urine, ANA, Anti-scleroderma antibody, RNP Antibody, Anti-Smith antibody, Sjogrens syndrome-A extractable nuclear antibody, Sjogrens syndrome-B extractable nuclear antibody, Anti-DNA antibody, double-stranded, C3 and C4, Beta-2 glycoprotein antibodies, Cardiolipin antibodies, IgG, IgM, IgA  Pain in both hands -complains of a stiffness in her bilateral hands and discomfort over the MCPs and PIP joints.  No synovitis was noted.  I will obtain additional labs and x-rays today.  Plan: Rheumatoid factor, Cyclic citrul peptide antibody, IgG, XR Hand 2 View Right, XR Hand 2 View Left.  X-rays of bilateral hands were unremarkable.  A handout on hand exercises was given.  Pain in both feet -she complains of discomfort in the bilateral feet.  No synovitis was noted on the examination.  Plan: XR Foot 2 Views Right, XR Foot 2 Views Left.  X-ray showed  right first MTP narrowing and bilateral calcaneal spurs.  Other fatigue -she gives history of mild fatigue.  Plan: CK, TSH, Serum protein electrophoresis with reflex  Vitamin D  deficiency-her vitamin D  was low at 13 on August 02, 2023.  Which could be contributing to myalgias and fatigue.  I will place her on vitamin D  50,000 units once a week for 3 months and then recheck vitamin D  level in 3 months.  She was advised to stay on  vitamin D  2000 units daily after that.  Chronic hypertension-blood pressure was 122/81.  Mild intermittent asthma without complication  PCOS (polycystic ovarian syndrome)  Anxiety and depression-she is on fluoxetine 20 mg p.o. daily.  BMI 50.0-59.9, adult (HCC)-she is on Wegovy  Orders: Orders Placed This Encounter  Procedures   XR Hand 2 View Right   XR Hand 2 View Left   XR Foot 2 Views Right   XR Foot 2 Views Left   CBC with Differential/Platelet   Comprehensive metabolic panel with GFR   Protein / creatinine ratio, urine   CK   TSH   Rheumatoid factor   Cyclic citrul peptide antibody, IgG   ANA   Anti-scleroderma antibody   RNP Antibody   Anti-Smith antibody   Sjogrens syndrome-A extractable nuclear antibody   Sjogrens syndrome-B extractable nuclear antibody   Anti-DNA antibody, double-stranded   C3 and C4   Beta-2 glycoprotein antibodies   Cardiolipin antibodies, IgG, IgM, IgA   Serum protein electrophoresis with reflex   VITAMIN D  25 Hydroxy (Vit-D Deficiency, Fractures)   Meds ordered this encounter  Medications   Vitamin D , Ergocalciferol , (DRISDOL ) 1.25 MG (50000 UNIT) CAPS capsule    Sig: Take 1 capsule (50,000 Units total) by mouth once a week.    Dispense:  12 capsule    Refill:  0    Face-to-face time spent with patient was 45 minutes. Greater than 50% of time was spent in counseling and coordination of care.  Follow-Up Instructions: Return for Polyarthralgia, positive ANA.   Maya Nash, MD  Note - This record has  been created using Animal nutritionist.  Chart creation errors have been sought, but may not always  have been located. Such creation errors do not reflect on  the standard of medical care.

## 2024-01-10 ENCOUNTER — Telehealth: Payer: Self-pay | Admitting: Rheumatology

## 2024-01-10 ENCOUNTER — Ambulatory Visit (INDEPENDENT_AMBULATORY_CARE_PROVIDER_SITE_OTHER)

## 2024-01-10 ENCOUNTER — Ambulatory Visit

## 2024-01-10 ENCOUNTER — Ambulatory Visit: Attending: Rheumatology | Admitting: Rheumatology

## 2024-01-10 ENCOUNTER — Encounter: Payer: Self-pay | Admitting: Rheumatology

## 2024-01-10 VITALS — BP 122/81 | HR 80 | Resp 17 | Ht 66.5 in | Wt 322.6 lb

## 2024-01-10 DIAGNOSIS — Z6841 Body Mass Index (BMI) 40.0 and over, adult: Secondary | ICD-10-CM | POA: Diagnosis present

## 2024-01-10 DIAGNOSIS — M79642 Pain in left hand: Secondary | ICD-10-CM | POA: Diagnosis present

## 2024-01-10 DIAGNOSIS — E282 Polycystic ovarian syndrome: Secondary | ICD-10-CM | POA: Insufficient documentation

## 2024-01-10 DIAGNOSIS — M79641 Pain in right hand: Secondary | ICD-10-CM | POA: Diagnosis present

## 2024-01-10 DIAGNOSIS — M79671 Pain in right foot: Secondary | ICD-10-CM

## 2024-01-10 DIAGNOSIS — J4520 Mild intermittent asthma, uncomplicated: Secondary | ICD-10-CM | POA: Diagnosis present

## 2024-01-10 DIAGNOSIS — E559 Vitamin D deficiency, unspecified: Secondary | ICD-10-CM | POA: Diagnosis present

## 2024-01-10 DIAGNOSIS — F32A Depression, unspecified: Secondary | ICD-10-CM | POA: Diagnosis present

## 2024-01-10 DIAGNOSIS — R768 Other specified abnormal immunological findings in serum: Secondary | ICD-10-CM | POA: Insufficient documentation

## 2024-01-10 DIAGNOSIS — M79672 Pain in left foot: Secondary | ICD-10-CM | POA: Diagnosis present

## 2024-01-10 DIAGNOSIS — I10 Essential (primary) hypertension: Secondary | ICD-10-CM | POA: Diagnosis present

## 2024-01-10 DIAGNOSIS — R5383 Other fatigue: Secondary | ICD-10-CM | POA: Insufficient documentation

## 2024-01-10 DIAGNOSIS — F419 Anxiety disorder, unspecified: Secondary | ICD-10-CM | POA: Diagnosis present

## 2024-01-10 MED ORDER — VITAMIN D (ERGOCALCIFEROL) 1.25 MG (50000 UNIT) PO CAPS: 50000.0000 [IU] | ORAL_CAPSULE | ORAL | 0 refills | Status: AC

## 2024-01-10 NOTE — Telephone Encounter (Signed)
 Patients wanted to make sure Dr. Dolphus will be giving her a prescription for vitamin D 

## 2024-01-10 NOTE — Patient Instructions (Signed)

## 2024-01-10 NOTE — Telephone Encounter (Signed)
 Called patient and advised that prescription has been sent to the pharmacy. Patient verbalized understanding.

## 2024-01-13 ENCOUNTER — Ambulatory Visit: Payer: Self-pay | Admitting: Rheumatology

## 2024-01-13 NOTE — Progress Notes (Signed)
 CBC and CMP are stable, liver functions are back to normal, urine protein creatinine ratio normal, ANA is low titer positive which is not significant, ENA panel negative, complements normal, TSH normal, CK normal, labs for rheumatoid arthritis (rheumatoid factor and anti-CCP) both negative. anticardiolipin and beta-2  GP 1 are pending.  Vitamin D  pending.  IFE pending.  We will discuss results at the follow-up visit in detail.

## 2024-01-16 LAB — COMPREHENSIVE METABOLIC PANEL WITH GFR
AG Ratio: 1.4 (calc) (ref 1.0–2.5)
ALT: 12 U/L (ref 6–29)
AST: 9 U/L — ABNORMAL LOW (ref 10–30)
Albumin: 4 g/dL (ref 3.6–5.1)
Alkaline phosphatase (APISO): 64 U/L (ref 31–125)
BUN/Creatinine Ratio: 11 (calc) (ref 6–22)
BUN: 12 mg/dL (ref 7–25)
CO2: 23 mmol/L (ref 20–32)
Calcium: 9.1 mg/dL (ref 8.6–10.2)
Chloride: 107 mmol/L (ref 98–110)
Creat: 1.05 mg/dL — ABNORMAL HIGH (ref 0.50–0.97)
Globulin: 2.9 g/dL (ref 1.9–3.7)
Glucose, Bld: 91 mg/dL (ref 65–99)
Potassium: 4.4 mmol/L (ref 3.5–5.3)
Sodium: 137 mmol/L (ref 135–146)
Total Bilirubin: 0.4 mg/dL (ref 0.2–1.2)
Total Protein: 6.9 g/dL (ref 6.1–8.1)
eGFR: 72 mL/min/1.73m2 (ref >=60)

## 2024-01-16 LAB — CBC WITH DIFFERENTIAL/PLATELET
Absolute Lymphocytes: 2622 {cells}/uL (ref 850–3900)
Absolute Monocytes: 395 {cells}/uL (ref 200–950)
Basophils Absolute: 23 {cells}/uL (ref 0–200)
Basophils Relative: 0.3 %
Eosinophils Absolute: 91 {cells}/uL (ref 15–500)
Eosinophils Relative: 1.2 %
HCT: 39.8 % (ref 35.0–45.0)
Hemoglobin: 12.4 g/dL (ref 11.7–15.5)
MCH: 26.7 pg — ABNORMAL LOW (ref 27.0–33.0)
MCHC: 31.2 g/dL — ABNORMAL LOW (ref 32.0–36.0)
MCV: 85.6 fL (ref 80.0–100.0)
MPV: 10.8 fL (ref 7.5–12.5)
Monocytes Relative: 5.2 %
Neutro Abs: 4469 {cells}/uL (ref 1500–7800)
Neutrophils Relative %: 58.8 %
Platelets: 270 Thousand/uL (ref 140–400)
RBC: 4.65 Million/uL (ref 3.80–5.10)
RDW: 13.6 % (ref 11.0–15.0)
Total Lymphocyte: 34.5 %
WBC: 7.6 Thousand/uL (ref 3.8–10.8)

## 2024-01-16 LAB — RHEUMATOID FACTOR: Rheumatoid fact SerPl-aCnc: <10 [IU]/mL (ref <14)

## 2024-01-16 LAB — C3 AND C4
C3 Complement: 164 mg/dL (ref 83–193)
C4 Complement: 37 mg/dL (ref 15–57)

## 2024-01-16 LAB — PROTEIN ELECTROPHORESIS, SERUM, WITH REFLEX
Albumin ELP: 3.9 g/dL (ref 3.8–4.8)
Alpha 1: 0.3 g/dL (ref 0.2–0.3)
Alpha 2: 0.7 g/dL (ref 0.5–0.9)
Beta 2: 0.5 g/dL (ref 0.2–0.5)
Beta Globulin: 0.4 g/dL (ref 0.4–0.6)
Gamma Globulin: 1.2 g/dL (ref 0.8–1.7)
Total Protein: 7 g/dL (ref 6.1–8.1)

## 2024-01-16 LAB — CK: Total CK: 78 U/L (ref 20–239)

## 2024-01-16 LAB — PROTEIN / CREATININE RATIO, URINE
Creatinine, Urine: 95 mg/dL (ref 20–275)
Total Protein, Urine: <4 mg/dL — ABNORMAL LOW (ref 5–24)

## 2024-01-16 LAB — RNP ANTIBODY: Ribonucleic Protein(ENA) Antibody, IgG: <1 AI

## 2024-01-16 LAB — IFE INTERPRETATION

## 2024-01-16 LAB — BETA-2 GLYCOPROTEIN ANTIBODIES
Beta-2 Glyco 1 IgA: <2 U/mL (ref <20.0)
Beta-2 Glyco 1 IgM: <2 U/mL (ref <20.0)
Beta-2 Glyco I IgG: <2 U/mL (ref <20.0)

## 2024-01-16 LAB — ANTI-SCLERODERMA ANTIBODY: Scleroderma (Scl-70) (ENA) Antibody, IgG: <1 AI

## 2024-01-16 LAB — ANTI-NUCLEAR AB-TITER (ANA TITER): ANA Titer 1: 1:40 {titer} — ABNORMAL HIGH

## 2024-01-16 LAB — CARDIOLIPIN ANTIBODIES, IGG, IGM, IGA
Anticardiolipin IgA: <2 [APL'U]/mL (ref <20.0)
Anticardiolipin IgG: <2 [GPL'U]/mL (ref <20.0)
Anticardiolipin IgM: <2 [MPL'U]/mL (ref <20.0)

## 2024-01-16 LAB — ANTI-SMITH ANTIBODY: ENA SM Ab Ser-aCnc: <1 AI

## 2024-01-16 LAB — ANA: Anti Nuclear Antibody (ANA): POSITIVE — AB

## 2024-01-16 LAB — SJOGRENS SYNDROME-A EXTRACTABLE NUCLEAR ANTIBODY: SSA (Ro) (ENA) Antibody, IgG: <1 AI

## 2024-01-16 LAB — SJOGRENS SYNDROME-B EXTRACTABLE NUCLEAR ANTIBODY: SSB (La) (ENA) Antibody, IgG: <1 AI

## 2024-01-16 LAB — CYCLIC CITRUL PEPTIDE ANTIBODY, IGG: Cyclic Citrullin Peptide Ab: <16 U

## 2024-01-16 LAB — ANTI-DNA ANTIBODY, DOUBLE-STRANDED: ds DNA Ab: <1 [IU]/mL

## 2024-01-16 LAB — TSH: TSH: 1.06 m[IU]/L

## 2024-01-16 NOTE — Progress Notes (Signed)
 CBC normal, ANA low titer positive and not significant, creatinine is mildly elevated.  Urine protein creatinine ratio normal, rheumatoid factor negative, anti-CCP negative, CK and TSH normal, ENA panel normal, complements normal.IFE normal.  I will discuss results at the follow-up visit.  Please forward results to her PCP.

## 2024-01-22 ENCOUNTER — Encounter: Payer: Self-pay | Admitting: Student

## 2024-02-07 NOTE — Progress Notes (Deleted)
 Office Visit Note  Patient: Tricia Ray             Date of Birth: Jul 17, 1990           MRN: [id number]             PCP: Department, [facility] Referring: Department, Lloyd Haws* Visit Date: 02/18/2024 Occupation: @GUAROCC @  Subjective:  No chief complaint on file.   History of Present Illness: Tricia Ray is a 33 y.o. female ***     Activities of Daily Living:  Patient reports morning stiffness for *** {minute/hour:19697}.   Patient {ACTIONS;DENIES/REPORTS:21021675::Denies} nocturnal pain.  Difficulty dressing/grooming: {ACTIONS;DENIES/REPORTS:21021675::Denies} Difficulty climbing stairs: {ACTIONS;DENIES/REPORTS:21021675::Denies} Difficulty getting out of chair: {ACTIONS;DENIES/REPORTS:21021675::Denies} Difficulty using hands for taps, buttons, cutlery, and/or writing: {ACTIONS;DENIES/REPORTS:21021675::Denies}  No Rheumatology ROS completed.   PMFS History:  Patient Active Problem List   Diagnosis Date Noted   Anxiety and depression 12/27/2023   Diabetes in pregnancy 06/21/2019   Chronic hypertension 06/21/2019   COVID-19 virus infection 06/21/2019   Severe pre-eclampsia 06/21/2019   Low vitamin D  level 05/25/2019   BMI 50.0-59.9, adult (HCC) 05/21/2019   LGA (large for gestational age) fetus affecting management of mother 05/21/2019   Chronic hypertension during pregnancy 05/13/2019   Obesity affecting pregnancy 05/09/2019   Supervision of high risk pregnancy, antepartum 04/23/2019   GDM (gestational diabetes mellitus) 04/23/2019   UTI (urinary tract infection) during pregnancy 04/23/2019   Asthma    PCOS (polycystic ovarian syndrome)     Past Medical History:  Diagnosis Date   Anemia    Asthma    Gestational diabetes    Gonorrhea    Hypertension    pregnancy induced   Morbid obesity (HCC)    PCOS (polycystic ovarian syndrome)    Seasonal allergies     Family History  Problem Relation Age of Onset   Cancer Mother    Leukemia  Mother    Diabetes Mother    Asthma Father    Healthy Daughter    Past Surgical History:  Procedure Laterality Date   CESAREAN SECTION N/A 06/23/2019   Procedure: CESAREAN SECTION;  Surgeon: [name], MD;  Location: [facility] LD ORS;  Service: Obstetrics;  Laterality: N/A;   HAND SURGERY Right    Social History   Social History Narrative   Not on file   Immunization History  Administered Date(s) Administered   Tdap 01/18/2018, 04/22/2019     Objective: Vital Signs: There were no vitals taken for this visit.   Physical Exam   Musculoskeletal Exam: ***  CDAI Exam: CDAI Score: -- Patient Global: --; Provider Global: -- Swollen: --; Tender: -- Joint Exam 02/18/2024   No joint exam has been documented for this visit   There is currently no information documented on the homunculus. Go to the Rheumatology activity and complete the homunculus joint exam.  Investigation: No additional findings.  Imaging: XR Hand 2 View Left Result Date: 01/10/2024 No MCP, PIP, DIP, CMC, intercarpal or radiocarpal joint space narrowing was noted.  No erosive changes were noted. Impression: Unremarkable x-rays of the hand.  XR Hand 2 View Right Result Date: 01/10/2024 No MCP, PIP, DIP, CMC, intercarpal or radiocarpal joint space narrowing was noted.  No erosive changes were noted. Impression: Unremarkable x-rays of the hand.  XR Foot 2 Views Left Result Date: 01/10/2024 No MTP, PIP or DIP narrowing was noted.  No intertarsal, tibiotalar or subtalar joint space narrowing was noted.  Inferior and posterior calcaneal spurs were noted. Impression: Early  degenerative changes were noted.  XR Foot 2 Views Right Result Date: 01/10/2024 First MTP narrowing was noted.  No PIP or DIP narrowing was noted.  No intertarsal, tibiotalar or subtalar joint space narrowing was noted.  Inferior and posterior calcaneal spurs were noted. Impression: Early degenerative changes were noted.   Recent Labs: Lab Results   Component Value Date   WBC 7.6 01/10/2024   HGB 12.4 01/10/2024   PLT 270 01/10/2024   NA 137 01/10/2024   K 4.4 01/10/2024   CL 107 01/10/2024   CO2 23 01/10/2024   GLUCOSE 91 01/10/2024   BUN 12 01/10/2024   CREATININE 1.05 (H) 01/10/2024   BILITOT 0.4 01/10/2024   ALKPHOS 185 (H) 09/04/2022   AST 9 (L) 01/10/2024   ALT 12 01/10/2024   PROT 6.9 01/10/2024   PROT 7.0 01/10/2024   ALBUMIN 3.5 09/04/2022   CALCIUM 9.1 01/10/2024   GFRAA >60 06/23/2019   January 10, 2024 IFE normal, urine protein creatinine ratio normal, ANA 1: 40 cytoplasmic, ENA panel negative, C3-C4 normal, beta-2  GP 1 negative, anticardiolipin negative, RF negative, anti-CCP negative, CK 78, TSH normal,  Speciality Comments: No specialty comments available.  Procedures:  No procedures performed Allergies: Shrimp extract, Shrimp [shellfish allergy], Aleve [naproxen sodium], Cherry, and Penicillins   Assessment / Plan:     Visit Diagnoses: No diagnosis found.  Orders: No orders of the defined types were placed in this encounter.  No orders of the defined types were placed in this encounter.   Face-to-face time spent with patient was *** minutes. Greater than 50% of time was spent in counseling and coordination of care.  Follow-Up Instructions: No follow-ups on file.   Maya Nash, MD  Note - This record has been created using Animal nutritionist.  Chart creation errors have been sought, but may not always  have been located. Such creation errors do not reflect on  the standard of medical care.

## 2024-02-18 ENCOUNTER — Ambulatory Visit: Admitting: Rheumatology

## 2024-02-18 DIAGNOSIS — M79641 Pain in right hand: Secondary | ICD-10-CM

## 2024-02-18 DIAGNOSIS — F32A Depression, unspecified: Secondary | ICD-10-CM

## 2024-02-18 DIAGNOSIS — Z6841 Body Mass Index (BMI) 40.0 and over, adult: Secondary | ICD-10-CM

## 2024-02-18 DIAGNOSIS — J4520 Mild intermittent asthma, uncomplicated: Secondary | ICD-10-CM

## 2024-02-18 DIAGNOSIS — R5383 Other fatigue: Secondary | ICD-10-CM

## 2024-02-18 DIAGNOSIS — E559 Vitamin D deficiency, unspecified: Secondary | ICD-10-CM

## 2024-02-18 DIAGNOSIS — M79671 Pain in right foot: Secondary | ICD-10-CM

## 2024-02-18 DIAGNOSIS — I10 Essential (primary) hypertension: Secondary | ICD-10-CM

## 2024-02-18 DIAGNOSIS — R768 Other specified abnormal immunological findings in serum: Secondary | ICD-10-CM

## 2024-02-18 DIAGNOSIS — E282 Polycystic ovarian syndrome: Secondary | ICD-10-CM

## 2024-02-22 ENCOUNTER — Telehealth: Payer: Self-pay | Admitting: Rheumatology

## 2024-02-22 NOTE — Telephone Encounter (Signed)
 Advised patient per lab note on 01/10/2024: CBC and CMP are stable, liver functions are back to normal, urine protein creatinine ratio normal, ANA is low titer positive which is not significant, ENA panel negative, complements normal, TSH normal, CK normal, labs for rheumatoid arthritis (rheumatoid factor and anti-CCP) both negative. IFE normal.   Advised that Dr. Dolphus will discuss results in detail at the follow up. Patient verbalized understanding.

## 2024-02-22 NOTE — Telephone Encounter (Signed)
 Pt called stating she would like Dr. Dolphus to call her and explain her lab results. Pt missed her NPT f/u and would like to know what her results mean. Pt states she can't wait till November to know.

## 2024-04-09 NOTE — Progress Notes (Deleted)
 Office Visit Note  Patient: Tricia Ray             Date of Birth: 10-Jun-1990           MRN: [id number]             PCP: Department, [facility] Referring: Department, Lloyd Haws* Visit Date: 04/23/2024 Occupation: Data Unavailable  Subjective:  No chief complaint on file.   History of Present Illness: Tricia Ray is a 33 y.o. female ***     Activities of Daily Living:  Patient reports morning stiffness for *** {minute/hour:19697}.   Patient {ACTIONS;DENIES/REPORTS:21021675::Denies} nocturnal pain.  Difficulty dressing/grooming: {ACTIONS;DENIES/REPORTS:21021675::Denies} Difficulty climbing stairs: {ACTIONS;DENIES/REPORTS:21021675::Denies} Difficulty getting out of chair: {ACTIONS;DENIES/REPORTS:21021675::Denies} Difficulty using hands for taps, buttons, cutlery, and/or writing: {ACTIONS;DENIES/REPORTS:21021675::Denies}  No Rheumatology ROS completed.   PMFS History:  Patient Active Problem List   Diagnosis Date Noted   Anxiety and depression 12/27/2023   Diabetes in pregnancy 06/21/2019   Chronic hypertension 06/21/2019   COVID-19 virus infection 06/21/2019   Severe pre-eclampsia 06/21/2019   Low vitamin D  level 05/25/2019   BMI 50.0-59.9, adult (HCC) 05/21/2019   LGA (large for gestational age) fetus affecting management of mother 05/21/2019   Chronic hypertension during pregnancy 05/13/2019   Obesity affecting pregnancy 05/09/2019   Supervision of high risk pregnancy, antepartum 04/23/2019   GDM (gestational diabetes mellitus) 04/23/2019   UTI (urinary tract infection) during pregnancy 04/23/2019   Asthma    PCOS (polycystic ovarian syndrome)     Past Medical History:  Diagnosis Date   Anemia    Asthma    Gestational diabetes    Gonorrhea    Hypertension    pregnancy induced   Morbid obesity (HCC)    PCOS (polycystic ovarian syndrome)    Seasonal allergies     Family History  Problem Relation Age of Onset   Cancer Mother     Leukemia Mother    Diabetes Mother    Asthma Father    Healthy Daughter    Past Surgical History:  Procedure Laterality Date   CESAREAN SECTION N/A 06/23/2019   Procedure: CESAREAN SECTION;  Surgeon: [name], MD;  Location: [facility] LD [facility]S;  Service: Obstetrics;  Laterality: N/A;   HAND SURGERY Right    Social History   Tobacco Use   Smoking status: Never    Passive exposure: Never   Smokeless tobacco: Never  Vaping Use   Vaping status: Never Used  Substance Use Topics   Alcohol use: Not Currently   Drug use: Not Currently    Types: Marijuana   Social History   Social History Narrative   Not on file     Immunization History  Administered Date(s) Administered   Tdap 01/18/2018, 04/22/2019     Objective: Vital Signs: There were no vitals taken for this visit.   Physical Exam   Musculoskeletal Exam: ***  CDAI Exam: CDAI Score: -- Patient Global: --; Provider Global: -- Swollen: --; Tender: -- Joint Exam 04/23/2024   No joint exam has been documented for this visit   There is currently no information documented on the homunculus. Go to the Rheumatology activity and complete the homunculus joint exam.  Investigation: No additional findings.  Imaging: No results found.  Recent Labs: Lab Results  Component Value Date   WBC 7.6 01/10/2024   HGB 12.4 01/10/2024   PLT 270 01/10/2024   NA 137 01/10/2024   K 4.4 01/10/2024   CL 107 01/10/2024   CO2 23 01/10/2024  GLUCOSE 91 01/10/2024   BUN 12 01/10/2024   CREATININE 1.05 (H) 01/10/2024   BILITOT 0.4 01/10/2024   ALKPHOS 185 (H) 09/04/2022   AST 9 (L) 01/10/2024   ALT 12 01/10/2024   PROT 6.9 01/10/2024   PROT 7.0 01/10/2024   ALBUMIN 3.5 09/04/2022   CALCIUM 9.1 01/10/2024   GFRAA >60 06/23/2019   January 10, 2024 IFE normal, TSH normal, CK 78, ANA 1: 40 cytoplasmic, ENA (SCL 70, RNP, Smith, SSA, SSB, dsDNA) negative, C3-C4 normal, anticardiolipin negative, beta-2  GP 1 negative, urine protein  creatinine ratio normal  Speciality Comments: No specialty comments available.  Procedures:  No procedures performed Allergies: Shrimp extract, Shrimp [shellfish allergy], Aleve [naproxen sodium], Cherry, and Penicillins   Assessment / Plan:     Visit Diagnoses: No diagnosis found.  Orders: No orders of the defined types were placed in this encounter.  No orders of the defined types were placed in this encounter.   Face-to-face time spent with patient was *** minutes. Greater than 50% of time was spent in counseling and coordination of care.  Follow-Up Instructions: No follow-ups on file.   Maya Nash, MD  Note - This record has been created using Animal nutritionist.  Chart creation errors have been sought, but may not always  have been located. Such creation errors do not reflect on  the standard of medical care.

## 2024-04-23 ENCOUNTER — Ambulatory Visit: Admitting: Rheumatology

## 2024-04-23 DIAGNOSIS — E559 Vitamin D deficiency, unspecified: Secondary | ICD-10-CM

## 2024-04-23 DIAGNOSIS — J4520 Mild intermittent asthma, uncomplicated: Secondary | ICD-10-CM

## 2024-04-23 DIAGNOSIS — F32A Depression, unspecified: Secondary | ICD-10-CM

## 2024-04-23 DIAGNOSIS — R7689 Other specified abnormal immunological findings in serum: Secondary | ICD-10-CM

## 2024-04-23 DIAGNOSIS — I10 Essential (primary) hypertension: Secondary | ICD-10-CM

## 2024-04-23 DIAGNOSIS — R5383 Other fatigue: Secondary | ICD-10-CM

## 2024-04-23 DIAGNOSIS — Z6841 Body Mass Index (BMI) 40.0 and over, adult: Secondary | ICD-10-CM

## 2024-04-23 DIAGNOSIS — E282 Polycystic ovarian syndrome: Secondary | ICD-10-CM

## 2024-04-23 DIAGNOSIS — M79641 Pain in right hand: Secondary | ICD-10-CM

## 2024-04-23 DIAGNOSIS — M79671 Pain in right foot: Secondary | ICD-10-CM
